# Patient Record
Sex: Male | Born: 1980
Health system: Southern US, Community
[De-identification: ages and names within clinical notes are randomized; demographics above are authoritative.]

## PROBLEM LIST (undated history)

## (undated) HISTORY — PX: KNEE SURGERY: SHX244

---
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
## 2006-07-12 ENCOUNTER — Emergency Department (HOSPITAL_COMMUNITY): Admission: EM | Admit: 2006-07-12 | Discharge: 2006-07-12 | Payer: Self-pay | Admitting: Emergency Medicine

## 2007-03-21 ENCOUNTER — Ambulatory Visit: Payer: Self-pay | Admitting: Internal Medicine

## 2007-04-17 ENCOUNTER — Emergency Department (HOSPITAL_COMMUNITY): Admission: EM | Admit: 2007-04-17 | Discharge: 2007-04-17 | Payer: Self-pay | Admitting: Emergency Medicine

## 2008-08-12 ENCOUNTER — Emergency Department (HOSPITAL_COMMUNITY): Admission: EM | Admit: 2008-08-12 | Discharge: 2008-08-12 | Payer: Self-pay | Admitting: Emergency Medicine

## 2008-08-24 ENCOUNTER — Emergency Department (HOSPITAL_COMMUNITY): Admission: EM | Admit: 2008-08-24 | Discharge: 2008-08-24 | Payer: Self-pay | Admitting: Family Medicine

## 2009-09-16 ENCOUNTER — Emergency Department (HOSPITAL_COMMUNITY): Admission: EM | Admit: 2009-09-16 | Discharge: 2009-09-16 | Payer: Self-pay | Admitting: Family Medicine

## 2011-01-30 ENCOUNTER — Inpatient Hospital Stay (INDEPENDENT_AMBULATORY_CARE_PROVIDER_SITE_OTHER)
Admission: RE | Admit: 2011-01-30 | Discharge: 2011-01-30 | Disposition: A | Discharge status: Home / Self Care | Payer: Self-pay | Source: Ambulatory Visit | Attending: Emergency Medicine | Admitting: Emergency Medicine

## 2011-01-30 DIAGNOSIS — R05 Cough: Secondary | ICD-10-CM

## 2011-01-30 DIAGNOSIS — J019 Acute sinusitis, unspecified: Secondary | ICD-10-CM

## 2011-02-11 ENCOUNTER — Emergency Department (HOSPITAL_COMMUNITY): Payer: Self-pay

## 2011-02-11 ENCOUNTER — Emergency Department (HOSPITAL_COMMUNITY)
Admission: EM | Admit: 2011-02-11 | Discharge: 2011-02-11 | Disposition: A | Discharge status: Home / Self Care | Payer: No Typology Code available for payment source | Attending: Emergency Medicine | Admitting: Emergency Medicine

## 2011-02-11 DIAGNOSIS — M25519 Pain in unspecified shoulder: Secondary | ICD-10-CM | POA: Insufficient documentation

## 2011-02-11 DIAGNOSIS — S335XXA Sprain of ligaments of lumbar spine, initial encounter: Secondary | ICD-10-CM | POA: Insufficient documentation

## 2011-02-11 DIAGNOSIS — M79609 Pain in unspecified limb: Secondary | ICD-10-CM | POA: Insufficient documentation

## 2011-02-11 DIAGNOSIS — IMO0002 Reserved for concepts with insufficient information to code with codable children: Secondary | ICD-10-CM | POA: Insufficient documentation

## 2011-02-11 DIAGNOSIS — M549 Dorsalgia, unspecified: Secondary | ICD-10-CM | POA: Insufficient documentation

## 2011-02-11 DIAGNOSIS — M542 Cervicalgia: Secondary | ICD-10-CM | POA: Insufficient documentation

## 2011-03-09 LAB — POCT URINALYSIS DIP (DEVICE)
Bilirubin Urine: NEGATIVE
Hgb urine dipstick: NEGATIVE
Ketones, ur: NEGATIVE mg/dL
Protein, ur: NEGATIVE mg/dL
Specific Gravity, Urine: 1.02 (ref 1.005–1.030)
Urobilinogen, UA: 0.2 mg/dL (ref 0.0–1.0)
pH: 6 (ref 5.0–8.0)

## 2011-04-21 NOTE — Assessment & Plan Note (Signed)
[facility]                             PULMONARY OFFICE NOTE   NAME:Darren Bartlett, Darren Bartlett                      MRN:          [id number]  DATE:03/21/2007                            DOB:          1981/09/26    Pulmonary new patient evaluation.   HISTORY:  A very nice, 30 year old, obese, black male who states he has  had asthma since childhood and typically uses his mother's albuterol but  now is using Primatene Mist several times a day to control symptomatic  wheezing with dyspnea that occurs both with activity and also  nocturnally. In fact, his last dose of Primatene is when he went to bed  last night. He denies any pleuritic pain, fevers, chills, sweats,  orthopnea or PND. He does have mild nasal congestion but no purulent  secretions.   He says the only time that he gets short of breath is when he panics  but he notes he is very sedentary and is not doing any kind of regular  exercise.   However, he denies any exertional chest pain, orthopnea, PND, leg  swelling or overt reflux symptoms.   PAST MEDICAL HISTORY:  Significant for obesity only.   ALLERGIES:  None known.   MEDICATIONS:  None regularly except p.r.n. Primatene.   SOCIAL HISTORY:  He quit smoking in 2006 and has gained significant  weight since then. Interestingly he did not notice any increase in  respiratory symptoms or need for albuterol after stopping smoking. He  works as a Designer, industrial/product.   FAMILY HISTORY:  Reviewed in detail and significant only for asthma in  his mother.   REVIEW OF SYSTEMS:  Reviewed in detail and significant for the problems  outlined above only.   PHYSICAL EXAMINATION:  GENERAL:  An obese, pleasant, ambulatory, black  male in no acute distress.  VITAL SIGNS:  He had stable vital signs.  HEENT:  Moderate turbinate edema. Oropharynx is clear.  There is no  evidence of excessive post nasal drainage or cobblestoning.  NECK:  Supple without cervical adenopathy  or tenderness. The trachea is  midline, no thyromegaly.  LUNGS:  Lung fields reveal trace wheeze with FVC maneuver only.  HEART:  He has a regular rate and rhythm without murmur, gallop or rub.  ABDOMEN:  Soft and benign.  EXTREMITIES:  Warm without calf tenderness, cyanosis, clubbing or edema.   IMPRESSION:  Life-long asthma with increased need for Primatene mist  over the last several months. I explained to the patient very carefully  why this was  a bad idea and emphasized morbidity and mortality with  asthma corresponds to excess beta 2 use with a goal of the rule of  twos.   To achieve this goal, I recommended instituting Symbicort 84.5 two puffs  b.i.d. and spent extra time teaching him how to use it. I fully realize  the patient will probably not use it consistently but at least with this  inhaler there will be immediate feedback in terms of both the benefit  and the lack of benefit when he is not using it as prescribed.  Followup will be in 6 weeks with a full set of PFTs since he is a smoker  to document whether or not we have reversed the reversible component  completely.     Charlaine Dalton. Sherene Sires, MD, [facility]  Electronically Signed    MBW/MedQ  DD: 03/21/2007  DT: 03/22/2007  Job #: [id number]

## 2011-06-19 ENCOUNTER — Inpatient Hospital Stay (INDEPENDENT_AMBULATORY_CARE_PROVIDER_SITE_OTHER)
Admission: RE | Admit: 2011-06-19 | Discharge: 2011-06-19 | Disposition: A | Discharge status: Home / Self Care | Payer: Self-pay | Source: Ambulatory Visit | Attending: Emergency Medicine | Admitting: Emergency Medicine

## 2011-06-19 DIAGNOSIS — F659 Paraphilia, unspecified: Secondary | ICD-10-CM

## 2011-06-19 LAB — HIV ANTIBODY (ROUTINE TESTING W REFLEX): HIV: NONREACTIVE

## 2011-06-20 LAB — GC/CHLAMYDIA PROBE AMP, GENITAL: Chlamydia, DNA Probe: NEGATIVE

## 2011-11-02 ENCOUNTER — Emergency Department (INDEPENDENT_AMBULATORY_CARE_PROVIDER_SITE_OTHER)
Admission: EM | Admit: 2011-11-02 | Discharge: 2011-11-02 | Disposition: A | Discharge status: Home / Self Care | Payer: Self-pay | Source: Home / Self Care | Attending: Family Medicine | Admitting: Family Medicine

## 2011-11-02 ENCOUNTER — Encounter: Payer: Self-pay | Admitting: Emergency Medicine

## 2011-11-02 DIAGNOSIS — B350 Tinea barbae and tinea capitis: Secondary | ICD-10-CM

## 2011-11-02 MED ORDER — GRISEOFULVIN MICROSIZE 500 MG PO TABS: 500.0000 mg | ORAL_TABLET | Freq: Every day | ORAL | Status: AC

## 2011-11-02 NOTE — ED Provider Notes (Signed)
History     CSN: [id number] Arrival date & time: 11/02/2011  9:27 PM   First MD Initiated Contact with Patient 11/02/11 1944      Chief Complaint  Patient presents with  . Rash    (Consider location/radiation/quality/duration/timing/severity/associated sxs/prior treatment) HPI Comments: Worried about itchy, grey, scaly patch of scalp on his RIGHT parietal area; exposed to tinea capitis in child of a friend, with whom he spends a lot of time; been present for approx 1 month; been trying over the counter topical preparations without relief. Denies any other symptoms.   Patient is a 30 y.o. male presenting with rash. The history is provided by the patient.  Rash  This is a new problem. The current episode started more than 1 week ago. The problem has not changed since onset.The rash is present on the scalp. The patient is experiencing no pain. Associated symptoms include itching. He has tried nothing for the symptoms.    History reviewed. No pertinent past medical history.  History reviewed. No pertinent past surgical history.  No family history on file.  History  Substance Use Topics  . Smoking status: Not on file  . Smokeless tobacco: Not on file  . Alcohol Use: Yes      Review of Systems  Constitutional: Negative.   HENT: Negative.   Eyes: Negative.   Respiratory: Negative.   Cardiovascular: Negative.   Gastrointestinal: Negative.   Genitourinary: Negative.   Musculoskeletal: Negative.   Skin: Positive for itching and rash.  Neurological: Negative.     Allergies  Review of patient's allergies indicates not on file.  Home Medications   Current Outpatient Rx  Name Route Sig Dispense Refill  . GRISEOFULVIN MICROSIZE 500 MG PO TABS Oral Take 1 tablet (500 mg total) by mouth daily. 42 tablet 0    BP 117/71  Pulse 73  Temp(Src) 98.2 F (36.8 C) (Tympanic)  Resp 20  SpO2 96%  Physical Exam  Constitutional: He is oriented to person, place, and time. He  appears well-developed and well-nourished.  HENT:  Head: Normocephalic and atraumatic.    Eyes: EOM are normal.  Neck: Normal range of motion.  Pulmonary/Chest: Effort normal.  Neurological: He is alert and oriented to person, place, and time.  Skin: Skin is warm and dry.    ED Course  Procedures (including critical care time)  Labs Reviewed - No data to display No results found.   1. Tinea capitis       MDM  Tinea capitis        Richardo Priest, MD 11/04/11 2324

## 2011-11-02 NOTE — ED Notes (Signed)
Pt here with possible ringworm to middle of scalp and itching that started 2 wks ago.pt states he was exposed to infection

## 2011-11-03 ENCOUNTER — Telehealth (HOSPITAL_COMMUNITY): Payer: Self-pay | Admitting: *Deleted

## 2014-09-04 ENCOUNTER — Ambulatory Visit: Payer: Self-pay | Admitting: Family Medicine

## 2014-09-04 LAB — DOT URINE DIP
BLOOD: NEGATIVE
GLUCOSE, UR: NEGATIVE
PROTEIN: NEGATIVE
SPECIFIC GRAVITY: 1.02 (ref 1.000–1.030)

## 2015-06-11 ENCOUNTER — Ambulatory Visit: Payer: Self-pay | Admitting: Internal Medicine

## 2015-06-14 ENCOUNTER — Ambulatory Visit: Payer: Self-pay | Admitting: Internal Medicine

## 2015-08-06 ENCOUNTER — Ambulatory Visit: Payer: Self-pay | Admitting: Internal Medicine

## 2018-12-01 ENCOUNTER — Emergency Department (HOSPITAL_COMMUNITY): Payer: Self-pay | Admitting: Certified Registered"

## 2018-12-01 ENCOUNTER — Encounter (HOSPITAL_COMMUNITY): Payer: Self-pay | Admitting: Emergency Medicine

## 2018-12-01 ENCOUNTER — Encounter (HOSPITAL_COMMUNITY): Admission: EM | Payer: Self-pay | Source: Home / Self Care | Attending: Emergency Medicine

## 2018-12-01 ENCOUNTER — Other Ambulatory Visit: Payer: Self-pay

## 2018-12-01 ENCOUNTER — Emergency Department (HOSPITAL_COMMUNITY): Payer: Self-pay

## 2018-12-01 ENCOUNTER — Ambulatory Visit (HOSPITAL_COMMUNITY)
Admission: EM | Admit: 2018-12-01 | Discharge: 2018-12-01 | Payer: Self-pay | Attending: Orthopedic Surgery | Admitting: Orthopedic Surgery

## 2018-12-01 DIAGNOSIS — S42451B Displaced fracture of lateral condyle of right humerus, initial encounter for open fracture: Secondary | ICD-10-CM | POA: Insufficient documentation

## 2018-12-01 DIAGNOSIS — S51001A Unspecified open wound of right elbow, initial encounter: Secondary | ICD-10-CM | POA: Insufficient documentation

## 2018-12-01 DIAGNOSIS — W3400XA Accidental discharge from unspecified firearms or gun, initial encounter: Secondary | ICD-10-CM

## 2018-12-01 DIAGNOSIS — M79641 Pain in right hand: Secondary | ICD-10-CM | POA: Insufficient documentation

## 2018-12-01 DIAGNOSIS — R200 Anesthesia of skin: Secondary | ICD-10-CM | POA: Insufficient documentation

## 2018-12-01 HISTORY — PX: I & D EXTREMITY: SHX5045

## 2018-12-01 LAB — CBC WITH DIFFERENTIAL/PLATELET
Abs Immature Granulocytes: 0.03 10*3/uL (ref 0.00–0.07)
BASOS ABS: 0.1 10*3/uL (ref 0.0–0.1)
Basophils Relative: 1 %
EOS ABS: 0.1 10*3/uL (ref 0.0–0.5)
EOS PCT: 1 %
HCT: 46.9 % (ref 39.0–52.0)
Hemoglobin: 14.7 g/dL (ref 13.0–17.0)
Immature Granulocytes: 0 %
LYMPHS ABS: 5 10*3/uL — AB (ref 0.7–4.0)
Lymphocytes Relative: 51 %
MCH: 28.5 pg (ref 26.0–34.0)
MCHC: 31.3 g/dL (ref 30.0–36.0)
MCV: 91.1 fL (ref 80.0–100.0)
MONO ABS: 0.6 10*3/uL (ref 0.1–1.0)
Monocytes Relative: 7 %
NRBC: 0 % (ref 0.0–0.2)
Neutro Abs: 3.9 10*3/uL (ref 1.7–7.7)
Neutrophils Relative %: 40 %
Platelets: 238 10*3/uL (ref 150–400)
RBC: 5.15 MIL/uL (ref 4.22–5.81)
RDW: 12.7 % (ref 11.5–15.5)
WBC: 9.7 10*3/uL (ref 4.0–10.5)

## 2018-12-01 LAB — BASIC METABOLIC PANEL
Anion gap: 15 (ref 5–15)
BUN: 16 mg/dL (ref 6–20)
CALCIUM: 8.9 mg/dL (ref 8.9–10.3)
CO2: 21 mmol/L — AB (ref 22–32)
CREATININE: 1.83 mg/dL — AB (ref 0.61–1.24)
Chloride: 104 mmol/L (ref 98–111)
GFR calc non Af Amer: 46 mL/min — ABNORMAL LOW (ref >60)
GFR, EST AFRICAN AMERICAN: 53 mL/min — AB (ref >60)
GLUCOSE: 157 mg/dL — AB (ref 70–99)
Potassium: 3.3 mmol/L — ABNORMAL LOW (ref 3.5–5.1)
Sodium: 140 mmol/L (ref 135–145)

## 2018-12-01 LAB — I-STAT CHEM 8, ED
BUN: 19 mg/dL (ref 6–20)
CALCIUM ION: 1.03 mmol/L — AB (ref 1.15–1.40)
Chloride: 106 mmol/L (ref 98–111)
Creatinine, Ser: 1.9 mg/dL — ABNORMAL HIGH (ref 0.61–1.24)
GLUCOSE: 159 mg/dL — AB (ref 70–99)
HCT: 46 % (ref 39.0–52.0)
Hemoglobin: 15.6 g/dL (ref 13.0–17.0)
Potassium: 3 mmol/L — ABNORMAL LOW (ref 3.5–5.1)
SODIUM: 142 mmol/L (ref 135–145)
TCO2: 23 mmol/L (ref 22–32)

## 2018-12-01 SURGERY — IRRIGATION AND DEBRIDEMENT EXTREMITY
Anesthesia: General | Site: Elbow | Laterality: Right

## 2018-12-01 MED ORDER — PROPOFOL 10 MG/ML IV BOLUS
INTRAVENOUS | Status: AC
  Filled 2018-12-01: qty 20

## 2018-12-01 MED ORDER — DEXAMETHASONE SODIUM PHOSPHATE 10 MG/ML IJ SOLN
INTRAMUSCULAR | Status: DC | PRN
  Administered 2018-12-01: 10 mg via INTRAVENOUS

## 2018-12-01 MED ORDER — LACTATED RINGERS IV SOLN
INTRAVENOUS | Status: DC | PRN
  Administered 2018-12-01: 07:00:00 via INTRAVENOUS

## 2018-12-01 MED ORDER — OXYCODONE HCL 5 MG PO TABS: 5.0000 mg | ORAL_TABLET | Freq: Once | ORAL | Status: DC | PRN

## 2018-12-01 MED ORDER — MIDAZOLAM HCL 5 MG/5ML IJ SOLN
INTRAMUSCULAR | Status: DC | PRN
  Administered 2018-12-01: 2 mg via INTRAVENOUS

## 2018-12-01 MED ORDER — LIDOCAINE 2% (20 MG/ML) 5 ML SYRINGE
INTRAMUSCULAR | Status: DC | PRN
  Administered 2018-12-01: 100 mg via INTRAVENOUS

## 2018-12-01 MED ORDER — CEFAZOLIN SODIUM-DEXTROSE 2-4 GM/100ML-% IV SOLN
INTRAVENOUS | Status: AC
  Filled 2018-12-01: qty 100

## 2018-12-01 MED ORDER — CEFAZOLIN SODIUM-DEXTROSE 2-3 GM-%(50ML) IV SOLR
INTRAVENOUS | Status: DC | PRN
  Administered 2018-12-01: 2 g via INTRAVENOUS

## 2018-12-01 MED ORDER — SODIUM CHLORIDE 0.9 % IV BOLUS
1000.0000 mL | Freq: Once | INTRAVENOUS | Status: AC
  Administered 2018-12-01: 1000 mL via INTRAVENOUS

## 2018-12-01 MED ORDER — ONDANSETRON HCL 4 MG/2ML IJ SOLN
INTRAMUSCULAR | Status: DC | PRN
  Administered 2018-12-01: 4 mg via INTRAVENOUS

## 2018-12-01 MED ORDER — SODIUM CHLORIDE 0.9 % IR SOLN
Status: DC | PRN
  Administered 2018-12-01: 3000 mL

## 2018-12-01 MED ORDER — FENTANYL CITRATE (PF) 250 MCG/5ML IJ SOLN
INTRAMUSCULAR | Status: DC | PRN
  Administered 2018-12-01 (×2): 100 ug via INTRAVENOUS
  Administered 2018-12-01: 50 ug via INTRAVENOUS

## 2018-12-01 MED ORDER — 0.9 % SODIUM CHLORIDE (POUR BTL) OPTIME
TOPICAL | Status: DC | PRN
  Administered 2018-12-01: 1000 mL

## 2018-12-01 MED ORDER — PROMETHAZINE HCL 25 MG/ML IJ SOLN: 6.2500 mg | INTRAMUSCULAR | Status: DC | PRN

## 2018-12-01 MED ORDER — CEFAZOLIN SODIUM-DEXTROSE 2-4 GM/100ML-% IV SOLN
2.0000 g | Freq: Once | INTRAVENOUS | Status: AC
  Administered 2018-12-01: 2 g via INTRAVENOUS
  Filled 2018-12-01: qty 100

## 2018-12-01 MED ORDER — HYDROMORPHONE HCL 1 MG/ML IJ SOLN: 0.2500 mg | INTRAMUSCULAR | Status: DC | PRN

## 2018-12-01 MED ORDER — PROPOFOL 10 MG/ML IV BOLUS
INTRAVENOUS | Status: DC | PRN
  Administered 2018-12-01: 200 mg via INTRAVENOUS

## 2018-12-01 MED ORDER — SUGAMMADEX SODIUM 500 MG/5ML IV SOLN
INTRAVENOUS | Status: DC | PRN
  Administered 2018-12-01: 300 mg via INTRAVENOUS

## 2018-12-01 MED ORDER — DEXAMETHASONE SODIUM PHOSPHATE 10 MG/ML IJ SOLN
INTRAMUSCULAR | Status: AC
  Filled 2018-12-01: qty 1

## 2018-12-01 MED ORDER — DEXMEDETOMIDINE HCL IN NACL 200 MCG/50ML IV SOLN
INTRAVENOUS | Status: DC | PRN
  Administered 2018-12-01: 12 ug via INTRAVENOUS

## 2018-12-01 MED ORDER — HYDROMORPHONE HCL 1 MG/ML IJ SOLN
1.0000 mg | Freq: Once | INTRAMUSCULAR | Status: AC
  Administered 2018-12-01: 1 mg via INTRAVENOUS
  Filled 2018-12-01: qty 1

## 2018-12-01 MED ORDER — FENTANYL CITRATE (PF) 250 MCG/5ML IJ SOLN
INTRAMUSCULAR | Status: AC
  Filled 2018-12-01: qty 5

## 2018-12-01 MED ORDER — ONDANSETRON HCL 4 MG/2ML IJ SOLN
4.0000 mg | Freq: Once | INTRAMUSCULAR | Status: AC
  Administered 2018-12-01: 4 mg via INTRAVENOUS
  Filled 2018-12-01: qty 2

## 2018-12-01 MED ORDER — SUCCINYLCHOLINE CHLORIDE 20 MG/ML IJ SOLN
INTRAMUSCULAR | Status: DC | PRN
  Administered 2018-12-01: 140 mg via INTRAVENOUS

## 2018-12-01 MED ORDER — ROCURONIUM BROMIDE 10 MG/ML (PF) SYRINGE
PREFILLED_SYRINGE | INTRAVENOUS | Status: DC | PRN
  Administered 2018-12-01: 30 mg via INTRAVENOUS

## 2018-12-01 MED ORDER — OXYCODONE HCL 5 MG/5ML PO SOLN: 5.0000 mg | Freq: Once | ORAL | Status: DC | PRN

## 2018-12-01 MED ORDER — LIDOCAINE 2% (20 MG/ML) 5 ML SYRINGE
INTRAMUSCULAR | Status: AC
  Filled 2018-12-01: qty 5

## 2018-12-01 MED ORDER — MEPERIDINE HCL 50 MG/ML IJ SOLN: 6.2500 mg | INTRAMUSCULAR | Status: DC | PRN

## 2018-12-01 MED ORDER — MIDAZOLAM HCL 2 MG/2ML IJ SOLN
INTRAMUSCULAR | Status: AC
  Filled 2018-12-01: qty 2

## 2018-12-01 SURGICAL SUPPLY — 30 items
BANDAGE ACE 4X5 VEL STRL LF (GAUZE/BANDAGES/DRESSINGS) ×5 IMPLANT
BNDG GAUZE ELAST 4 BULKY (GAUZE/BANDAGES/DRESSINGS) ×5 IMPLANT
CORDS BIPOLAR (ELECTRODE) ×3 IMPLANT
COVER SURGICAL LIGHT HANDLE (MISCELLANEOUS) ×3 IMPLANT
CUFF TOURNIQUET SINGLE 18IN (TOURNIQUET CUFF) ×5 IMPLANT
DRAIN PENROSE 1/4X12 LTX STRL (WOUND CARE) ×2 IMPLANT
DRAPE OEC MINIVIEW 54X84 (DRAPES) ×2 IMPLANT
DRSG ADAPTIC 3X8 NADH LF (GAUZE/BANDAGES/DRESSINGS) ×3 IMPLANT
DRSG XEROFORM 1X8 (GAUZE/BANDAGES/DRESSINGS) ×2 IMPLANT
GAUZE SPONGE 4X4 12PLY STRL (GAUZE/BANDAGES/DRESSINGS) ×3 IMPLANT
GLOVE SS BIOGEL STRL SZ 8 (GLOVE) ×1 IMPLANT
GLOVE SUPERSENSE BIOGEL SZ 8 (GLOVE) ×2
GOWN STRL REUS W/ TWL XL LVL3 (GOWN DISPOSABLE) ×2 IMPLANT
GOWN STRL REUS W/TWL XL LVL3 (GOWN DISPOSABLE) ×6
KIT BASIN OR (CUSTOM PROCEDURE TRAY) ×3 IMPLANT
KIT TURNOVER KIT B (KITS) ×3 IMPLANT
MANIFOLD NEPTUNE II (INSTRUMENTS) ×3 IMPLANT
NS IRRIG 1000ML POUR BTL (IV SOLUTION) ×3 IMPLANT
PACK ORTHO EXTREMITY (CUSTOM PROCEDURE TRAY) ×3 IMPLANT
PAD ARMBOARD 7.5X6 YLW CONV (MISCELLANEOUS) ×3 IMPLANT
PAD CAST 4YDX4 CTTN HI CHSV (CAST SUPPLIES) ×1 IMPLANT
PADDING CAST COTTON 4X4 STRL (CAST SUPPLIES) ×6
SCRUB BETADINE 4OZ XXX (MISCELLANEOUS) ×3 IMPLANT
SET CYSTO W/LG BORE CLAMP LF (SET/KITS/TRAYS/PACK) ×5 IMPLANT
TOWEL OR 17X24 6PK STRL BLUE (TOWEL DISPOSABLE) ×3 IMPLANT
TOWEL OR 17X26 10 PK STRL BLUE (TOWEL DISPOSABLE) ×3 IMPLANT
TUBE CONNECTING 12'X1/4 (SUCTIONS) ×1
TUBE CONNECTING 12X1/4 (SUCTIONS) ×2 IMPLANT
WATER STERILE IRR 1000ML POUR (IV SOLUTION) ×3 IMPLANT
YANKAUER SUCT BULB TIP NO VENT (SUCTIONS) ×3 IMPLANT

## 2018-12-01 NOTE — Anesthesia Preprocedure Evaluation (Signed)
Anesthesia Evaluation  Patient identified by MRN, date of birth, ID band Patient awake    Reviewed: Allergy & Precautions, NPO status , Patient's Chart, lab work & pertinent test results  Airway Mallampati: I  TM Distance: >3 FB Neck ROM: Full    Dental  (+) Dental Advisory Given   Pulmonary neg pulmonary ROS,    Pulmonary exam normal breath sounds clear to auscultation       Cardiovascular negative cardio ROS Normal cardiovascular exam Rhythm:Regular Rate:Normal     Neuro/Psych negative neurological ROS  negative psych ROS   GI/Hepatic negative GI ROS, Neg liver ROS,   Endo/Other  negative endocrine ROS  Renal/GU negative Renal ROS     Musculoskeletal negative musculoskeletal ROS (+)   Abdominal (+) + obese,   Peds  Hematology negative hematology ROS (+)   Anesthesia Other Findings   Reproductive/Obstetrics                             Anesthesia Physical Anesthesia Plan  ASA: II  Anesthesia Plan: General   Post-op Pain Management:    Induction: Intravenous  PONV Risk Score and Plan: 3 and Ondansetron, Dexamethasone and Midazolam  Airway Management Planned: LMA  Additional Equipment: None  Intra-op Plan:   Post-operative Plan: Extubation in OR  Informed Consent: I have reviewed the patients History and Physical, chart, labs and discussed the procedure including the risks, benefits and alternatives for the proposed anesthesia with the patient or authorized representative who has indicated his/her understanding and acceptance.   Dental advisory given  Plan Discussed with: CRNA  Anesthesia Plan Comments:         Anesthesia Quick Evaluation
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
## 2018-12-01 NOTE — Anesthesia Postprocedure Evaluation (Signed)
Anesthesia Post Note  Patient: Darren Bartlett  Procedure(s) Performed: IRRIGATION AND DEBRIDEMENT EXTREMITY (Right Elbow)     Patient location during evaluation: PACU Anesthesia Type: General Level of consciousness: sedated and patient cooperative Pain management: pain level controlled Vital Signs Assessment: post-procedure vital signs reviewed and stable Respiratory status: spontaneous breathing Cardiovascular status: stable Anesthetic complications: no    Last Vitals:  Vitals:   12/01/18 0916 12/01/18 1006  BP: (!) 146/86 139/84  Pulse: 63 82  Resp: 18 15  Temp: (!) 36.4 C 36.6 C  SpO2: 95% 100%    Last Pain:  Vitals:   12/01/18 0342  PainSc: 3                  Lewie LoronJohn Jaciel Diem

## 2018-12-01 NOTE — Transfer of Care (Signed)
Immediate Anesthesia Transfer of Care Note  Patient: Darren Bartlett  Procedure(s) Performed: IRRIGATION AND DEBRIDEMENT EXTREMITY (Right Elbow)  Patient Location: PACU  Anesthesia Type:General  Level of Consciousness: awake, alert , oriented and patient cooperative  Airway & Oxygen Therapy: Patient Spontanous Breathing and Patient connected to face mask oxygen  Post-op Assessment: Report given to RN, Post -op Vital signs reviewed and stable and Patient moving all extremities  Post vital signs: Reviewed and stable  Last Vitals:  Vitals Value Taken Time  BP 134/82 12/01/2018  8:16 AM  Temp    Pulse 74 12/01/2018  8:17 AM  Resp 19 12/01/2018  8:17 AM  SpO2 96 % 12/01/2018  8:17 AM  Vitals shown include unvalidated device data.  Last Pain:  Vitals:   12/01/18 0342  PainSc: 3          Complications: No apparent anesthesia complications
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
## 2018-12-01 NOTE — Anesthesia Procedure Notes (Signed)
Procedure Name: Intubation Date/Time: 12/01/2018 7:31 AM Performed by: Myna Bright, CRNA Pre-anesthesia Checklist: Patient identified, Emergency Drugs available, Suction available and Patient being monitored Patient Re-evaluated:Patient Re-evaluated prior to induction Oxygen Delivery Method: Circle system utilized Preoxygenation: Pre-oxygenation with 100% oxygen Induction Type: IV induction Ventilation: Mask ventilation without difficulty Laryngoscope Size: Mac and 4 Grade View: Grade I Tube type: Oral Tube size: 7.5 mm Number of attempts: 1 Airway Equipment and Method: Stylet Placement Confirmation: ETT inserted through vocal cords under direct vision,  positive ETCO2 and breath sounds checked- equal and bilateral Secured at: 22 cm Tube secured with: Tape Dental Injury: Teeth and Oropharynx as per pre-operative assessment
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
## 2018-12-01 NOTE — Progress Notes (Signed)
Discharge instructions and prescriptions reviewed with Verlon [facility]Leslie, [facility] [place] Patients parents at bedside and made aware of Patient being discharged in custody at this time.

## 2018-12-01 NOTE — Discharge Instructions (Signed)
Discharge to home. Keep dressing clean dry & intact. Do not change dressing. Follow up with Dr in 8-10 days.

## 2018-12-01 NOTE — ED Notes (Signed)
Patient transported to X-ray 
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
## 2018-12-01 NOTE — ED Notes (Signed)
Patient transported to CT 

## 2018-12-01 NOTE — Op Note (Signed)
See dict #[id number] Amanda PeaGramig MD

## 2018-12-02 ENCOUNTER — Encounter (HOSPITAL_COMMUNITY): Payer: Self-pay | Admitting: Orthopedic Surgery

## 2019-10-10 DIAGNOSIS — S39012A Strain of muscle, fascia and tendon of lower back, initial encounter: Secondary | ICD-10-CM | POA: Diagnosis not present

## 2019-11-17 ENCOUNTER — Other Ambulatory Visit (HOSPITAL_COMMUNITY)
Admission: RE | Admit: 2019-11-17 | Discharge: 2019-11-17 | Disposition: A | Discharge status: Home / Self Care | Payer: BC Managed Care – PPO | Source: Ambulatory Visit | Attending: Family Medicine | Admitting: Family Medicine

## 2019-11-17 ENCOUNTER — Encounter: Payer: Self-pay | Admitting: Family Medicine

## 2019-11-17 ENCOUNTER — Other Ambulatory Visit: Payer: Self-pay

## 2019-11-17 ENCOUNTER — Ambulatory Visit (INDEPENDENT_AMBULATORY_CARE_PROVIDER_SITE_OTHER): Payer: BC Managed Care – PPO | Admitting: Family Medicine

## 2019-11-17 VITALS — BP 116/62 | HR 76 | Temp 97.6°F | Ht 70.0 in | Wt 243.2 lb

## 2019-11-17 DIAGNOSIS — R350 Frequency of micturition: Secondary | ICD-10-CM | POA: Insufficient documentation

## 2019-11-17 DIAGNOSIS — Z0000 Encounter for general adult medical examination without abnormal findings: Secondary | ICD-10-CM | POA: Diagnosis not present

## 2019-11-17 LAB — COMPREHENSIVE METABOLIC PANEL
ALT: 38 U/L (ref 0–53)
AST: 24 U/L (ref 0–37)
Albumin: 4 g/dL (ref 3.5–5.2)
Alkaline Phosphatase: 58 U/L (ref 39–117)
BUN: 13 mg/dL (ref 6–23)
CO2: 24 mEq/L (ref 19–32)
Calcium: 9 mg/dL (ref 8.4–10.5)
Chloride: 110 mEq/L (ref 96–112)
Creatinine, Ser: 1.25 mg/dL (ref 0.40–1.50)
GFR: 77.69 mL/min (ref >60.00)
Glucose, Bld: 108 mg/dL — ABNORMAL HIGH (ref 70–99)
Potassium: 4.1 mEq/L (ref 3.5–5.1)
Sodium: 142 mEq/L (ref 135–145)
Total Bilirubin: 0.4 mg/dL (ref 0.2–1.2)
Total Protein: 6.8 g/dL (ref 6.0–8.3)

## 2019-11-17 LAB — POCT URINALYSIS DIPSTICK
Bilirubin, UA: NEGATIVE
Blood, UA: NEGATIVE
Glucose, UA: NEGATIVE
Ketones, UA: NEGATIVE
Leukocytes, UA: NEGATIVE
Nitrite, UA: NEGATIVE
Protein, UA: POSITIVE — AB
Spec Grav, UA: >=1.03 — AB (ref 1.010–1.025)
Urobilinogen, UA: 0.2 E.U./dL
pH, UA: 6 (ref 5.0–8.0)

## 2019-11-17 LAB — URINALYSIS, ROUTINE W REFLEX MICROSCOPIC
Bilirubin Urine: NEGATIVE
Hgb urine dipstick: NEGATIVE
Ketones, ur: NEGATIVE
Leukocytes,Ua: NEGATIVE
Nitrite: NEGATIVE
Specific Gravity, Urine: >=1.03 — AB (ref 1.000–1.030)
Total Protein, Urine: NEGATIVE
Urine Glucose: NEGATIVE
Urobilinogen, UA: 0.2 (ref 0.0–1.0)
pH: 6 (ref 5.0–8.0)

## 2019-11-17 LAB — CBC
HCT: 41.5 % (ref 39.0–52.0)
Hemoglobin: 13.7 g/dL (ref 13.0–17.0)
MCHC: 33.1 g/dL (ref 30.0–36.0)
MCV: 90 fl (ref 78.0–100.0)
Platelets: 179 10*3/uL (ref 150.0–400.0)
RBC: 4.61 Mil/uL (ref 4.22–5.81)
RDW: 13.4 % (ref 11.5–15.5)
WBC: 5.6 10*3/uL (ref 4.0–10.5)

## 2019-11-17 LAB — LDL CHOLESTEROL, DIRECT: Direct LDL: 99 mg/dL

## 2019-11-17 LAB — HEMOGLOBIN A1C: Hgb A1c MFr Bld: 5.7 % (ref 4.6–6.5)

## 2019-11-17 NOTE — Progress Notes (Addendum)
New Patient Office Visit  Subjective:  Patient ID: Darren Bartlett, male    DOB: 1981-11-26  Age: 38 y.o. MRN: [id number]  CC:  Chief Complaint  Patient presents with  . New Patient (Initial Visit)  . Annual Exam  . Urinary Frequency    denies dysuria    HPI Darren Bartlett presents for establishment of care and a complete physical exam.  Enjoys good health as far as he knows.  He is a Administrator.  He lives alone.  He rarely drinks alcohol.  Says that he drinks at most once monthly.  He does not use illicit drugs.  He does smoke cigars on a daily basis.  He was able to see the dentist earlier this year.  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  Sleep apnea was mentioned at his next exam.  He does not snore as far as he knows.  He has no significant daytime sleepiness.  He has been having urinary frequency.  He has known family history of diabetes that he is aware of.  He did have a bowl of cereal this morning.  History reviewed. No pertinent past medical history.  Past Surgical History:  Procedure Laterality Date  . I & D EXTREMITY Right 12/01/2018   Procedure: IRRIGATION AND DEBRIDEMENT EXTREMITY;  Surgeon: Roseanne Kaufman, MD;  Location: [facility];  Service: Orthopedics;  Laterality: Right;  . KNEE SURGERY      Family History  Problem Relation Age of Onset  . Asthma Mother   . Hypertension Mother   . Hypertension Father     Social History   Socioeconomic History  . Marital status: Single    Spouse name: Not on file  . Number of children: Not on file  . Years of education: Not on file  . Highest education level: Not on file  Occupational History  . Not on file  Tobacco Use  . Smoking status: Current Every Day Smoker    Types: Cigars  . Smokeless tobacco: Never Used  Substance and Sexual Activity  . Alcohol use: Yes    Comment: occ.  . Drug use: No  . Sexual activity: Yes    Birth control/protection: Condom  Other Topics Concern  . Not on file  Social History  Narrative  . Not on file   Social Determinants of Health   Financial Resource Strain:   . Difficulty of Paying Living Expenses: Not on file  Food Insecurity:   . Worried About Charity fundraiser in the Last Year: Not on file  . Ran Out of Food in the Last Year: Not on file  Transportation Needs:   . Lack of Transportation (Medical): Not on file  . Lack of Transportation (Non-Medical): Not on file  Physical Activity:   . Days of Exercise per Week: Not on file  . Minutes of Exercise per Session: Not on file  Stress:   . Feeling of Stress : Not on file  Social Connections:   . Frequency of Communication with Friends and Family: Not on file  . Frequency of Social Gatherings with Friends and Family: Not on file  . Attends Religious Services: Not on file  . Active Member of Clubs or Organizations: Not on file  . Attends Archivist Meetings: Not on file  . Marital Status: Not on file  Intimate Partner Violence:   . Fear of Current or Ex-Partner: Not on file  . Emotionally Abused: Not on file  .  Physically Abused: Not on file  . Sexually Abused: Not on file    ROS Review of Systems  Constitutional: Negative for chills, diaphoresis, fatigue, fever and unexpected weight change.  HENT: Negative.   Eyes: Negative for photophobia and visual disturbance.  Respiratory: Negative.   Cardiovascular: Negative.   Gastrointestinal: Negative.   Endocrine: Negative for polyphagia and polyuria.  Genitourinary: Positive for frequency. Negative for difficulty urinating, dysuria and urgency.  Musculoskeletal: Negative for joint swelling and myalgias.  Skin: Negative for pallor and rash.  Allergic/Immunologic: Negative for immunocompromised state.  Neurological: Negative for speech difficulty and numbness.  Hematological: Negative.   Psychiatric/Behavioral: Negative.     Objective:   Today's Vitals: BP 116/62   Pulse 76   Temp 97.6 F (36.4 C)   Ht '5\' 10"'$  (1.778 m)   Wt 243 lb  3.2 oz (110.3 kg)   SpO2 96%   BMI 34.90 kg/m   Physical Exam Vitals and nursing note reviewed.  Constitutional:      General: He is not in acute distress.    Appearance: Normal appearance. He is normal weight. He is not ill-appearing, toxic-appearing or diaphoretic.  HENT:     Head: Normocephalic and atraumatic.     Right Ear: Tympanic membrane, ear canal and external ear normal. There is no impacted cerumen.     Left Ear: Tympanic membrane, ear canal and external ear normal. There is no impacted cerumen.     Nose: No congestion or rhinorrhea.     Mouth/Throat:     Mouth: Mucous membranes are dry.     Pharynx: Oropharynx is clear. No oropharyngeal exudate or posterior oropharyngeal erythema.  Eyes:     General: No scleral icterus.       Right eye: No discharge.        Left eye: No discharge.     Conjunctiva/sclera: Conjunctivae normal.     Pupils: Pupils are equal, round, and reactive to light.  Neck:     Vascular: No carotid bruit.  Cardiovascular:     Rate and Rhythm: Normal rate and regular rhythm.     Heart sounds: No murmur.  Pulmonary:     Effort: Pulmonary effort is normal.     Breath sounds: Normal breath sounds.  Abdominal:     General: Abdomen is flat. Bowel sounds are normal. There is no distension.     Palpations: Abdomen is soft.     Tenderness: There is no abdominal tenderness. There is no guarding or rebound.     Hernia: No hernia is present. There is no hernia in the left inguinal area or right inguinal area.  Genitourinary:    Pubic Area: No rash or pubic lice.      Penis: Normal and circumcised. No hypospadias, erythema, tenderness, discharge, swelling or lesions.      Testes:        Right: Mass, tenderness or swelling not present. Right testis is descended.        Left: Mass, tenderness or swelling not present. Left testis is descended.  Musculoskeletal:     Cervical back: No rigidity or tenderness.     Right lower leg: No edema.     Left lower leg:  No edema.  Lymphadenopathy:     Cervical: No cervical adenopathy.     Lower Body: No right inguinal adenopathy. No left inguinal adenopathy.  Skin:    General: Skin is warm and dry.  Neurological:     Mental Status: He is alert and  oriented to person, place, and time.  Psychiatric:        Mood and Affect: Mood normal.        Behavior: Behavior normal.     Assessment & Plan:   Problem List Items Addressed This Visit      Other   Healthcare maintenance   Relevant Orders   CBC (Completed)   Comp Met (CMET) (Completed)   Direct LDL (Completed)   HgB A1c (Completed)   HIV antibody (with reflex) (Completed)   Urine frequency - Primary   Relevant Orders   POCT urinalysis dipstick (Completed)   HgB A1c (Completed)   Urinalysis, Routine w reflex microscopic (Completed)   Urine cytology ancillary only([facility]) (Completed)   Ambulatory referral to Urology      No outpatient encounter medications on file as of 11/17/2019.   No facility-administered encounter medications on file as of 11/17/2019.    Follow-up: Return in about 6 months (around 05/17/2020).  Patient given information on health maintenance and disease prevention.  He was also given information on the harmful effects of smoking and advised to stop.  Follow-up will pending results of lab work.  Libby Maw, MD

## 2019-11-17 NOTE — Patient Instructions (Signed)
Health Maintenance, Male Adopting a healthy lifestyle and getting preventive care are important in promoting health and wellness. Ask your health care provider about:  The right schedule for you to have regular tests and exams.  Things you can do on your own to prevent diseases and keep yourself healthy. What should I know about diet, weight, and exercise? Eat a healthy diet   Eat a diet that includes plenty of vegetables, fruits, low-fat dairy products, and lean protein.  Do not eat a lot of foods that are high in solid fats, added sugars, or sodium. Maintain a healthy weight Body mass index (BMI) is a measurement that can be used to identify possible weight problems. It estimates body fat based on height and weight. Your health care provider can help determine your BMI and help you achieve or maintain a healthy weight. Get regular exercise Get regular exercise. This is one of the most important things you can do for your health. Most adults should:  Exercise for at least 150 minutes each week. The exercise should increase your heart rate and make you sweat (moderate-intensity exercise).  Do strengthening exercises at least twice a week. This is in addition to the moderate-intensity exercise.  Spend less time sitting. Even light physical activity can be beneficial. Watch cholesterol and blood lipids Have your blood tested for lipids and cholesterol at 39 years of age, then have this test every 5 years. You may need to have your cholesterol levels checked more often if:  Your lipid or cholesterol levels are high.  You are older than 40 years of age.  You are at high risk for heart disease. What should I know about cancer screening? Many types of cancers can be detected early and may often be prevented. Depending on your health history and family history, you may need to have cancer screening at various ages. This may include screening for:  Colorectal cancer.  Prostate  cancer.  Skin cancer.  Lung cancer. What should I know about heart disease, diabetes, and high blood pressure? Blood pressure and heart disease  High blood pressure causes heart disease and increases the risk of stroke. This is more likely to develop in people who have high blood pressure readings, are of African descent, or are overweight.  Talk with your health care provider about your target blood pressure readings.  Have your blood pressure checked: ? Every 3-5 years if you are 18-39 years of age. ? Every year if you are 40 years old or older.  If you are between the ages of 65 and 75 and are a current or former smoker, ask your health care provider if you should have a one-time screening for abdominal aortic aneurysm (AAA). Diabetes Have regular diabetes screenings. This checks your fasting blood sugar level. Have the screening done:  Once every three years after age 45 if you are at a normal weight and have a low risk for diabetes.  More often and at a younger age if you are overweight or have a high risk for diabetes. What should I know about preventing infection? Hepatitis B If you have a higher risk for hepatitis B, you should be screened for this virus. Talk with your health care provider to find out if you are at risk for hepatitis B infection. Hepatitis C Blood testing is recommended for:  Everyone born from 1945 through 1965.  Anyone with known risk factors for hepatitis C. Sexually transmitted infections (STIs)  You should be screened each year   for STIs, including gonorrhea and chlamydia, if: ? You are sexually active and are younger than 39 years of age. ? You are older than 39 years of age and your health care provider tells you that you are at risk for this type of infection. ? Your sexual activity has changed since you were last screened, and you are at increased risk for chlamydia or gonorrhea. Ask your health care provider if you are at risk.  Ask your  health care provider about whether you are at high risk for HIV. Your health care provider may recommend a prescription medicine to help prevent HIV infection. If you choose to take medicine to prevent HIV, you should first get tested for HIV. You should then be tested every 3 months for as long as you are taking the medicine. Follow these instructions at home: Lifestyle  Do not use any products that contain nicotine or tobacco, such as cigarettes, e-cigarettes, and chewing tobacco. If you need help quitting, ask your health care provider.  Do not use street drugs.  Do not share needles.  Ask your health care provider for help if you need support or information about quitting drugs. Alcohol use  Do not drink alcohol if your health care provider tells you not to drink.  If you drink alcohol: ? Limit how much you have to 0-2 drinks a day. ? Be aware of how much alcohol is in your drink. In the [place] one drink equals one 12 oz bottle of beer (355 mL), one 5 oz glass of wine (148 mL), or one 1 oz glass of hard liquor (44 mL). General instructions  Schedule regular health, dental, and eye exams.  Stay current with your vaccines.  Tell your health care provider if: ? You often feel depressed. ? You have ever been abused or do not feel safe at home. Summary  Adopting a healthy lifestyle and getting preventive care are important in promoting health and wellness.  Follow your health care provider's instructions about healthy diet, exercising, and getting tested or screened for diseases.  Follow your health care provider's instructions on monitoring your cholesterol and blood pressure. This information is not intended to replace advice given to you by your health care provider. Make sure you discuss any questions you have with your health care provider. Document Released: 05/18/2008 Document Revised: 11/13/2018 Document Reviewed: 11/13/2018 Elsevier Patient Education  2020 [place] 90-29 Years Old, Male Preventive care refers to lifestyle choices and visits with your health care provider that can promote health and wellness. This includes:  A yearly physical exam. This is also called an annual well check.  Regular dental and eye exams.  Immunizations.  Screening for certain conditions.  Healthy lifestyle choices, such as eating a healthy diet, getting regular exercise, not using drugs or products that contain nicotine and tobacco, and limiting alcohol use. What can I expect for my preventive care visit? Physical exam Your health care provider will check:  Height and weight. These may be used to calculate body mass index (BMI), which is a measurement that tells if you are at a healthy weight.  Heart rate and blood pressure.  Your skin for abnormal spots. Counseling Your health care provider may ask you questions about:  Alcohol, tobacco, and drug use.  Emotional well-being.  Home and relationship well-being.  Sexual activity.  Eating habits.  Work and work Statistician. What immunizations do I need?  Influenza (flu) vaccine  This is recommended every  year. Tetanus, diphtheria, and pertussis (Tdap) vaccine  You may need a Td booster every 10 years. Varicella (chickenpox) vaccine  You may need this vaccine if you have not already been vaccinated. Human papillomavirus (HPV) vaccine  If recommended by your health care provider, you may need three doses over 6 months. Measles, mumps, and rubella (MMR) vaccine  You may need at least one dose of MMR. You may also need a second dose. Meningococcal conjugate (MenACWY) vaccine  One dose is recommended if you are 57-42 years old and a Market researcher living in a residence hall, or if you have one of several medical conditions. You may also need additional booster doses. Pneumococcal conjugate (PCV13) vaccine  You may need this if you have certain conditions and were  not previously vaccinated. Pneumococcal polysaccharide (PPSV23) vaccine  You may need one or two doses if you smoke cigarettes or if you have certain conditions. Hepatitis A vaccine  You may need this if you have certain conditions or if you travel or work in places where you may be exposed to hepatitis A. Hepatitis B vaccine  You may need this if you have certain conditions or if you travel or work in places where you may be exposed to hepatitis B. Haemophilus influenzae type b (Hib) vaccine  You may need this if you have certain risk factors. You may receive vaccines as individual doses or as more than one vaccine together in one shot (combination vaccines). Talk with your health care provider about the risks and benefits of combination vaccines. What tests do I need? Blood tests  Lipid and cholesterol levels. These may be checked every 5 years starting at age 5.  Hepatitis C test.  Hepatitis B test. Screening   Diabetes screening. This is done by checking your blood sugar (glucose) after you have not eaten for a while (fasting).  Sexually transmitted disease (STD) testing. Talk with your health care provider about your test results, treatment options, and if necessary, the need for more tests. Follow these instructions at home: Eating and drinking   Eat a diet that includes fresh fruits and vegetables, whole grains, lean protein, and low-fat dairy products.  Take vitamin and mineral supplements as recommended by your health care provider.  Do not drink alcohol if your health care provider tells you not to drink.  If you drink alcohol: ? Limit how much you have to 0-2 drinks a day. ? Be aware of how much alcohol is in your drink. In the [place] one drink equals one 12 oz bottle of beer (355 mL), one 5 oz glass of wine (148 mL), or one 1 oz glass of hard liquor (44 mL). Lifestyle  Take daily care of your teeth and gums.  Stay active. Exercise for at least 30 minutes on  5 or more days each week.  Do not use any products that contain nicotine or tobacco, such as cigarettes, e-cigarettes, and chewing tobacco. If you need help quitting, ask your health care provider.  If you are sexually active, practice safe sex. Use a condom or other form of protection to prevent STIs (sexually transmitted infections). What's next?  Go to your health care provider once a year for a well check visit.  Ask your health care provider how often you should have your eyes and teeth checked.  Stay up to date on all vaccines. This information is not intended to replace advice given to you by your health care provider. Make sure you discuss  any questions you have with your health care provider. Document Released: 01/16/2002 Document Revised: 11/14/2018 Document Reviewed: 11/14/2018 Elsevier Patient Education  2020 [place] Risks of Smoking Smoking cigarettes is very bad for your health. Tobacco smoke has over 200 known poisons in it. It contains the poisonous gases nitrogen oxide and carbon monoxide. There are over 60 chemicals in tobacco smoke that cause cancer. Smoking is difficult to quit because a chemical in tobacco, called nicotine, causes addiction or dependence. When you smoke and inhale, nicotine is absorbed rapidly into the bloodstream through your lungs. Both inhaled and non-inhaled nicotine may be addictive. What are the risks of cigarette smoke? Cigarette smokers have an increased risk of many serious medical problems, including:  Lung cancer.  Lung disease, such as pneumonia, bronchitis, and emphysema.  Chest pain (angina) and heart attack because the heart is not getting enough oxygen.  Heart disease and peripheral blood vessel disease.  High blood pressure (hypertension).  Stroke.  Oral cancer, including cancer of the lip, mouth, or voice box.  Bladder cancer.  Pancreatic cancer.  Cervical cancer.  Pregnancy complications, including  premature birth.  Stillbirths and smaller newborn babies, birth defects, and genetic damage to sperm.  Early menopause.  Lower estrogen level for women.  Infertility.  Facial wrinkles.  Blindness.  Increased risk of broken bones (fractures).  Senile dementia.  Stomach ulcers and internal bleeding.  Delayed wound healing and increased risk of complications during surgery.  Even smoking lightly shortens your life expectancy by several years. Because of secondhand smoke exposure, children of smokers have an increased risk of the following:  Sudden infant death syndrome (SIDS).  Respiratory infections.  Lung cancer.  Heart disease.  Ear infections. What are the benefits of quitting? There are many health benefits of quitting smoking. Here are some of them:  Within days of quitting smoking, your risk of having a heart attack decreases, your blood flow improves, and your lung capacity improves. Blood pressure, pulse rate, and breathing patterns start returning to normal soon after quitting.  Within months, your lungs may clear up completely.  Quitting for 10 years reduces your risk of developing lung cancer and heart disease to almost that of a nonsmoker.  People who quit may see an improvement in their overall quality of life. How do I quit smoking?     Smoking is an addiction with both physical and psychological effects, and longtime habits can be hard to change. Your health care provider can recommend:  Programs and community resources, which may include group support, education, or talk therapy.  Prescription medicines to help reduce cravings.  Nicotine replacement products, such as patches, gum, and nasal sprays. Use these products only as directed. Do not replace cigarette smoking with electronic cigarettes, which are commonly called e-cigarettes. The safety of e-cigarettes is not known, and some may contain harmful chemicals.  A combination of two or more of  these methods. Where to find more information  American Lung Association: www.lung.org  American Cancer Society: www.cancer.org Summary  Smoking cigarettes is very bad for your health. Cigarette smokers have an increased risk of many serious medical problems, including several cancers, heart disease, and stroke.  Smoking is an addiction with both physical and psychological effects, and longtime habits can be hard to change.  By stopping right away, you can greatly reduce the risk of medical problems for you and your family.  To help you quit smoking, your health care provider can recommend programs, community resources,  prescription medicines, and nicotine replacement products such as patches, gum, and nasal sprays. This information is not intended to replace advice given to you by your health care provider. Make sure you discuss any questions you have with your health care provider. Document Released: 12/28/2004 Document Revised: 02/21/2018 Document Reviewed: 11/24/2016 Elsevier Patient Education  2020 [employer].

## 2019-11-18 LAB — URINE CYTOLOGY ANCILLARY ONLY
Chlamydia: NEGATIVE
Comment: NEGATIVE
Comment: NORMAL
Neisseria Gonorrhea: NEGATIVE

## 2019-11-18 LAB — HIV ANTIBODY (ROUTINE TESTING W REFLEX): HIV 1&2 Ab, 4th Generation: NONREACTIVE

## 2019-11-20 NOTE — Addendum Note (Signed)
Addended by: Jon Billings on: 11/20/2019 04:19 PM   Modules accepted: Orders

## 2019-12-25 DIAGNOSIS — R3915 Urgency of urination: Secondary | ICD-10-CM | POA: Diagnosis not present

## 2019-12-25 DIAGNOSIS — R351 Nocturia: Secondary | ICD-10-CM | POA: Diagnosis not present

## 2019-12-25 DIAGNOSIS — R350 Frequency of micturition: Secondary | ICD-10-CM | POA: Diagnosis not present

## 2020-02-17 ENCOUNTER — Other Ambulatory Visit (INDEPENDENT_AMBULATORY_CARE_PROVIDER_SITE_OTHER): Payer: BC Managed Care – PPO

## 2020-02-17 ENCOUNTER — Other Ambulatory Visit: Payer: Self-pay

## 2020-02-17 ENCOUNTER — Other Ambulatory Visit: Payer: Self-pay | Admitting: Internal Medicine

## 2020-02-17 ENCOUNTER — Encounter: Payer: Self-pay | Admitting: Internal Medicine

## 2020-02-17 ENCOUNTER — Ambulatory Visit (INDEPENDENT_AMBULATORY_CARE_PROVIDER_SITE_OTHER)
Admission: RE | Admit: 2020-02-17 | Discharge: 2020-02-17 | Disposition: A | Discharge status: Home / Self Care | Payer: BC Managed Care – PPO | Source: Ambulatory Visit | Attending: Internal Medicine | Admitting: Internal Medicine

## 2020-02-17 ENCOUNTER — Ambulatory Visit (INDEPENDENT_AMBULATORY_CARE_PROVIDER_SITE_OTHER): Payer: BC Managed Care – PPO | Admitting: Internal Medicine

## 2020-02-17 ENCOUNTER — Ambulatory Visit: Payer: BC Managed Care – PPO

## 2020-02-17 DIAGNOSIS — J45991 Cough variant asthma: Secondary | ICD-10-CM | POA: Diagnosis not present

## 2020-02-17 DIAGNOSIS — R0602 Shortness of breath: Secondary | ICD-10-CM | POA: Diagnosis not present

## 2020-02-17 DIAGNOSIS — R05 Cough: Secondary | ICD-10-CM | POA: Diagnosis not present

## 2020-02-17 LAB — CBC WITH DIFFERENTIAL/PLATELET
Basophils Absolute: 0 10*3/uL (ref 0.0–0.1)
Basophils Relative: 0.6 % (ref 0.0–3.0)
Eosinophils Absolute: 0.1 10*3/uL (ref 0.0–0.7)
Eosinophils Relative: 1.1 % (ref 0.0–5.0)
HCT: 46.1 % (ref 39.0–52.0)
Hemoglobin: 15.4 g/dL (ref 13.0–17.0)
Lymphocytes Relative: 24.7 % (ref 12.0–46.0)
Lymphs Abs: 1.7 10*3/uL (ref 0.7–4.0)
MCHC: 33.4 g/dL (ref 30.0–36.0)
MCV: 90 fl (ref 78.0–100.0)
Monocytes Absolute: 0.4 10*3/uL (ref 0.1–1.0)
Monocytes Relative: 5.9 % (ref 3.0–12.0)
Neutro Abs: 4.7 10*3/uL (ref 1.4–7.7)
Neutrophils Relative %: 67.7 % (ref 43.0–77.0)
Platelets: 208 10*3/uL (ref 150.0–400.0)
RBC: 5.12 Mil/uL (ref 4.22–5.81)
RDW: 13.3 % (ref 11.5–15.5)
WBC: 6.9 10*3/uL (ref 4.0–10.5)

## 2020-02-17 MED ORDER — FAMOTIDINE 20 MG PO TABS: ORAL_TABLET | ORAL | 11 refills | Status: DC

## 2020-02-17 MED ORDER — PANTOPRAZOLE SODIUM 40 MG PO TBEC: 40.0000 mg | DELAYED_RELEASE_TABLET | Freq: Every day | ORAL | 2 refills | Status: DC

## 2020-02-17 MED ORDER — ALBUTEROL SULFATE HFA 108 (90 BASE) MCG/ACT IN AERS: INHALATION_SPRAY | RESPIRATORY_TRACT | 1 refills | Status: DC

## 2020-02-17 NOTE — Patient Instructions (Addendum)
Pantoprazole (protonix) 40 mg   Take  30-60 min before first meal of the day and Pepcid (famotidine)  20 mg one after supper or  bedtime until return to office - this is the best way to tell whether stomach acid is contributing to your problem.   GERD (REFLUX)  is an extremely common cause of respiratory symptoms just like yours , many times with no obvious heartburn at all.    It can be treated with medication, but also with lifestyle changes including elevation of the head of your bed (ideally with 6 -8inch blocks under the headboard of your bed),  Smoking cessation, avoidance of late meals, excessive alcohol, and avoid fatty foods, chocolate, peppermint, colas, red wine, and acidic juices such as orange juice.  NO MINT OR MENTHOL PRODUCTS SO NO COUGH DROPS  USE SUGARLESS CANDY INSTEAD (Jolley ranchers or Stover's or Life Savers) or even ice chips will also do - the key is to swallow to prevent all throat clearing. NO OIL BASED VITAMINS - use powdered substitutes.  Avoid fish oil when coughing.  Back up: Only use your albuterol (PROAIR/ red)  as a rescue medication to be used if you can't catch your breath by resting or doing a relaxed purse lip breathing pattern.  - The less you use it, the better it will work when you need it. - Ok to use up to 2 puffs  every 4 hours if you must but call for immediate appointment if use goes up over your usual need - Don't leave home without it !!  (think of it like the spare tire for your car)   For itching/ sneezing / runny nose with pollen or other exposures:  Allegra or clariton over the counter won't make you sleepy - take as needed   Please remember to go to the lab and x-ray department   for your tests - we will call you with the results when they are available.     Please schedule a follow up office visit in 6 weeks, call sooner if needed with all medications /inhalers/ solutions in hand so we can verify exactly what you are taking. This includes all  medications from all doctors and over the counters

## 2020-02-17 NOTE — Progress Notes (Signed)
Darren Bartlett, male    DOB: [birth year],    MRN: [id number]   Brief patient profile:  76 yobm cigar smoker remembers needing primatene was around age 39 for wheezing/sob but never with sports and eventually started needing saba prn but   not noct and was monthly then turned into more of a weekly event in 2020 so self-referred to pulmonary clinic 02/17/2020      History of Present Illness  02/17/2020  Pulmonary/ 1st office eval/Darren Bartlett  Chief Complaint  Patient presents with  . Pulmonary Consult    Self referral. Pt c/o wheezing and SOB off and on for years. He has concern for asthma b/c his mother has asthma. He occ uses an albuterol inhaler and this helps ease his symptoms some.   Dyspnea:  Still working out 3 x weekly including treadmill and bike s problem Cough: clears throat a lot but no mucus  Sleep: fine  SABA use: once or twice a week freq hot sensation in chest better with tums or burping  Works driving truck locally / older house with crawl space / central heat /air Some itching/ sneezing with pollen but not even using otcs/ works as Naval architect   No obvious day to day or daytime variability or assoc excess/ purulent sputum or mucus plugs or hemoptysis or cp or chest tightness, subjective wheeze.     sleeping without nocturnal  or early am exacerbation  of respiratory  c/o's or need for noct saba. Also denies any obvious fluctuation of symptoms with weather or environmental changes or other aggravating or alleviating factors except as outlined above   No unusual exposure hx or h/o childhood pna/ asthma or knowledge of premature birth.  Current Allergies, Complete Past Medical History, Past Surgical History, Family History, and Social History were reviewed in [employer] record.  ROS  The following are not active complaints unless bolded Hoarseness, sore throat, dysphagia, dental problems, itching, sneezing,  nasal congestion or discharge of excess mucus  or purulent secretions, ear ache,   fever, chills, sweats, unintended wt loss or wt gain, classically pleuritic or exertional cp,  orthopnea pnd or arm/hand swelling  or leg swelling, presyncope, palpitations, abdominal pain, anorexia, nausea, vomiting, diarrhea  or change in bowel habits or change in bladder habits, change in stools or change in urine, dysuria, hematuria,  rash, arthralgias, visual complaints, headache, numbness, weakness or ataxia or problems with walking or coordination,  change in mood or  memory.            No past medical history on file.  No outpatient medications prior to visit.   No facility-administered medications prior to visit.     Objective:     BP 140/84 (BP Location: Left Arm, Cuff Size: Normal)   Pulse 82   Temp 98.1 F (36.7 C) (Temporal)   Ht 5\' 10"  (1.778 m)   Wt 241 lb (109.3 kg)   SpO2 99% Comment: on RA  BMI 34.58 kg/m   SpO2: 99 %(on RA)   amb bm clearing throat    HEENT : pt wearing mask not removed for exam due to covid -19 concerns.    NECK :  without JVD/Nodes/TM/ nl carotid upstrokes bilaterally   LUNGS: no acc muscle use,  Nl contour chest which is clear to A and P bilaterally without cough on insp or exp maneuvers   CV:  RRR  no s3 or murmur or increase in P2, and no edema  ABD:  soft and nontender with nl inspiratory excursion in the supine position. No bruits or organomegaly appreciated, bowel sounds nl  MS:  Nl gait/ ext warm without deformities, calf tenderness, cyanosis or clubbing No obvious joint restrictions   SKIN: warm and dry without lesions    NEURO:  alert, approp, nl sensorium with  no motor or cerebellar deficits apparent.     CXR PA and Lateral:   02/17/2020 :    I personally reviewed images and agree with radiology impression as follows:   Negative.  No active cardiopulmonary disease.   Lab Results  Component Value Date   WBC 6.9 02/17/2020   HGB 15.4 02/17/2020   HCT 46.1 02/17/2020   MCV  90.0 02/17/2020   PLT 208.0 02/17/2020       EOS                                                               0.1                                    02/17/2020   Labs ordered 02/17/2020  :  allergy profile          Assessment   Cough variant asthma vs UACS  Onset age 14 never with ex/ sleep -  Allergy profile 02/17/2020 >  Eos 0.1 /  IgE pending - 02/17/2020 max rx for GERD/ saba prn    This is not by any means typical asthma and more likely Upper airway cough syndrome (previously labeled PNDS),  is so named because it's frequently impossible to sort out how much is  CR/sinusitis with freq throat clearing (which can be related to primary GERD)   vs  causing  secondary (" extra esophageal")  GERD from wide swings in gastric pressure that occur with throat clearing, often  promoting self use of mint and menthol lozenges that reduce the lower esophageal sphincter tone and exacerbate the problem further in a cyclical fashion.   These are the same pts (now being labeled as having "irritable larynx syndrome" by some cough centers) who not infrequently have a history of having failed to tolerate ace inhibitors,  dry powder inhalers or biphosphonates or report having atypical/extraesophageal reflux symptoms that don't respond to standard doses of PPI  and are easily confused as having aecopd or asthma flares by even experienced allergists/ pulmonologists (myself included).   >>>>   first try max rx for gerd with ppi, h2 and diet  and advised:  Only use your albuterol as a rescue medication to be used if you can't catch your breath by resting or doing a relaxed purse lip breathing pattern.  - The less you use it, the better it will work when you need it. - Ok to use up to 2 puffs  every 4 hours if you must but call for immediate appointment if use goes up over your usual need - Don't leave home without it !!  (think of it like the spare tire for your car)   And: If your breathing worsens or you need to  use your rescue inhaler more than twice weekly or wake up more than twice a month with any  respiratory symptoms or require more than two rescue inhalers per year, we need to see you right away because this means we're not controlling the underlying problem (inflammation) adequately.  Rescue inhalers (albuterol) do not control inflammation and overuse can lead to unnecessary and costly consequences.  They can make you feel better temporarily but eventually they will quit working effectively much as sleep aids lead to more insomnia if used regularly.         Each maintenance medication was reviewed in detail including emphasizing most importantly the difference between maintenance and prns and under what circumstances the prns are to be triggered using an action plan format where appropriate.  Total time for H and P, chart review, counseling, teaching device and generating customized AVS unique to this initial office visit / charting = 45 min            Christinia Gully, MD 02/17/2020

## 2020-02-18 ENCOUNTER — Encounter: Payer: Self-pay | Admitting: Internal Medicine

## 2020-02-18 LAB — IGE: IgE (Immunoglobulin E), Serum: 205 kU/L — ABNORMAL HIGH (ref <=114)

## 2020-02-18 NOTE — Assessment & Plan Note (Addendum)
Onset age 39 never with ex/ sleep -  Allergy profile 02/17/2020 >  Eos 0.1 /  IgE pending - 02/17/2020 max rx for GERD/ saba prn    This is not by any means typical asthma and more likely Upper airway cough syndrome (previously labeled PNDS),  is so named because it's frequently impossible to sort out how much is  CR/sinusitis with freq throat clearing (which can be related to primary GERD)   vs  causing  secondary (" extra esophageal")  GERD from wide swings in gastric pressure that occur with throat clearing, often  promoting self use of mint and menthol lozenges that reduce the lower esophageal sphincter tone and exacerbate the problem further in a cyclical fashion.   These are the same pts (now being labeled as having "irritable larynx syndrome" by some cough centers) who not infrequently have a history of having failed to tolerate ace inhibitors,  dry powder inhalers or biphosphonates or report having atypical/extraesophageal reflux symptoms that don't respond to standard doses of PPI  and are easily confused as having aecopd or asthma flares by even experienced allergists/ pulmonologists (myself included).   >>>>   first try max rx for gerd with ppi, h2 and diet  and advised:  Only use your albuterol as a rescue medication to be used if you can't catch your breath by resting or doing a relaxed purse lip breathing pattern.  - The less you use it, the better it will work when you need it. - Ok to use up to 2 puffs  every 4 hours if you must but call for immediate appointment if use goes up over your usual need - Don't leave home without it !!  (think of it like the spare tire for your car)   And: If your breathing worsens or you need to use your rescue inhaler more than twice weekly or wake up more than twice a month with any respiratory symptoms or require more than two rescue inhalers per year, we need to see you right away because this means we're not controlling the underlying problem  (inflammation) adequately.  Rescue inhalers (albuterol) do not control inflammation and overuse can lead to unnecessary and costly consequences.  They can make you feel better temporarily but eventually they will quit working effectively much as sleep aids lead to more insomnia if used regularly.         Each maintenance medication was reviewed in detail including emphasizing most importantly the difference between maintenance and prns and under what circumstances the prns are to be triggered using an action plan format where appropriate.  Total time for H and P, chart review, counseling, teaching device and generating customized AVS unique to this initial office visit / charting = 45 min

## 2020-02-19 NOTE — Progress Notes (Signed)
Spoke with pt and notified of results per Dr. Wert. Pt verbalized understanding.

## 2020-02-19 NOTE — Progress Notes (Signed)
Spoke with pt and notified of results per Dr. Wert. Pt verbalized understanding and denied any questions. 

## 2020-05-19 ENCOUNTER — Other Ambulatory Visit: Payer: Self-pay

## 2020-05-20 ENCOUNTER — Ambulatory Visit: Payer: Self-pay | Admitting: Family Medicine

## 2020-05-25 ENCOUNTER — Encounter: Payer: Self-pay | Admitting: Family Medicine

## 2020-06-02 ENCOUNTER — Encounter: Payer: Self-pay | Admitting: Family Medicine

## 2020-06-02 ENCOUNTER — Telehealth: Payer: Self-pay | Admitting: Family Medicine

## 2020-06-02 NOTE — Telephone Encounter (Signed)
Pt was no show for appt 05/20/2020. A no show letter has been mailed. This is the first no show and the fee waived as a one time courtesy.   PCP,  Please reply back with corresponding letter matching appropriate follow up needs.  A - No follow up necessary B - Follow up urgent - locate patient immediately to schedule appointment. C - Follow up necessary. Contact patient and schedule visit w/in 7 days. D - Follow up necessary. Contact patient and schedule visit w/in 2-4 weeks.

## 2020-06-03 NOTE — Telephone Encounter (Signed)
May follow up when ready.

## 2020-06-21 ENCOUNTER — Telehealth: Payer: Self-pay | Admitting: Family Medicine

## 2020-06-21 NOTE — Telephone Encounter (Signed)
Called patient to reschedule appt missed from 05/20/20. Patient stated he will call back to reschedule.
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
## 2021-04-29 ENCOUNTER — Encounter (HOSPITAL_COMMUNITY): Payer: Self-pay

## 2021-04-29 ENCOUNTER — Other Ambulatory Visit: Payer: Self-pay

## 2021-04-29 ENCOUNTER — Ambulatory Visit (HOSPITAL_COMMUNITY)
Admission: EM | Admit: 2021-04-29 | Discharge: 2021-04-29 | Disposition: A | Discharge status: Home / Self Care | Payer: Self-pay | Attending: Internal Medicine | Admitting: Internal Medicine

## 2021-04-29 DIAGNOSIS — J069 Acute upper respiratory infection, unspecified: Secondary | ICD-10-CM | POA: Insufficient documentation

## 2021-04-29 DIAGNOSIS — F17210 Nicotine dependence, cigarettes, uncomplicated: Secondary | ICD-10-CM | POA: Insufficient documentation

## 2021-04-29 DIAGNOSIS — R509 Fever, unspecified: Secondary | ICD-10-CM | POA: Insufficient documentation

## 2021-04-29 DIAGNOSIS — Z20822 Contact with and (suspected) exposure to covid-19: Secondary | ICD-10-CM | POA: Insufficient documentation

## 2021-04-29 DIAGNOSIS — R059 Cough, unspecified: Secondary | ICD-10-CM | POA: Insufficient documentation

## 2021-04-29 MED ORDER — PROMETHAZINE-DM 6.25-15 MG/5ML PO SYRP: 5.0000 mL | ORAL_SOLUTION | Freq: Three times a day (TID) | ORAL | 0 refills | Status: DC | PRN

## 2021-04-29 MED ORDER — IBUPROFEN 800 MG PO TABS
ORAL_TABLET | ORAL | Status: AC
  Filled 2021-04-29: qty 1

## 2021-04-29 MED ORDER — ACETAMINOPHEN 325 MG PO TABS
ORAL_TABLET | ORAL | Status: AC
  Filled 2021-04-29: qty 2

## 2021-04-29 MED ORDER — ALBUTEROL SULFATE HFA 108 (90 BASE) MCG/ACT IN AERS: INHALATION_SPRAY | RESPIRATORY_TRACT | 0 refills | Status: DC

## 2021-04-29 MED ORDER — IBUPROFEN 800 MG PO TABS
800.0000 mg | ORAL_TABLET | Freq: Once | ORAL | Status: AC
  Administered 2021-04-29: 800 mg via ORAL

## 2021-04-29 MED ORDER — ACETAMINOPHEN 325 MG PO TABS
650.0000 mg | ORAL_TABLET | Freq: Once | ORAL | Status: AC
  Administered 2021-04-29: 650 mg via ORAL

## 2021-04-29 NOTE — Discharge Instructions (Addendum)
I suspect you have a virus.  We will contact you if your COVID-19 testing is positive.  Please alternate Tylenol and ibuprofen for fever relief.  I have called in an albuterol inhaler to use for coughing fits or shortness of breath.  Take Promethazine DM up to 3 times a day as needed for cough.  This will make you sleepy so please do not drive or drink alcohol with this.  Use Mucinex and Flonase for additional symptom relief.  If anything worsens please return for reevaluation.

## 2021-04-29 NOTE — ED Provider Notes (Addendum)
[facility]    CSN: [id number] Arrival date & time: 04/29/21  1704      History   Chief Complaint Chief Complaint  Patient presents with  . Chills  . Fever    HPI Darren Bartlett is a 40 y.o. male.   Patient presents today with a 2-day history of fever.  He reports associated chills, body aches, dry cough.  Denies any chest pain, shortness of breath, nausea, vomiting.  He has taken Motrin without improvement of symptoms.  He denies any known sick contacts.  He has had COVID-19 vaccinations but has not had booster.  He has not had flu shot.  He denies history of allergies but does have a history of asthma.  He does not have an albuterol inhaler available but does not feel like he has needed this since symptom onset.  He is requesting refill to have on hand in case needed in the future.  He is a current everyday smoker smoking is typical amount.  He has not missed work as result of symptoms.     History reviewed. No pertinent past medical history.  Patient Active Problem List   Diagnosis Date Noted  . Cough variant asthma vs UACS  02/17/2020  . Healthcare maintenance 11/17/2019  . Urine frequency 11/17/2019    Past Surgical History:  Procedure Laterality Date  . I & D EXTREMITY Right 12/01/2018   Procedure: IRRIGATION AND DEBRIDEMENT EXTREMITY;  Surgeon: Dominica Severin, MD;  Location: [facility] OR;  Service: Orthopedics;  Laterality: Right;  . KNEE SURGERY         Home Medications    Prior to Admission medications   Medication Sig Start Date End Date Taking? Authorizing Provider  promethazine-dextromethorphan (PROMETHAZINE-DM) 6.25-15 MG/5ML syrup Take 5 mLs by mouth 3 (three) times daily as needed for cough. 04/29/21  Yes Keller Mikels K, PA-C  albuterol (PROAIR HFA) 108 (90 Base) MCG/ACT inhaler 2 puffs every 4 hours as needed only  if your can't catch your breath 04/29/21   Danija Gosa, Denny Peon K, PA-C  famotidine (PEPCID) 20 MG tablet One after supper 02/17/20   Nyoka Cowden, MD  pantoprazole (PROTONIX) 40 MG tablet Take 1 tablet (40 mg total) by mouth daily. Take 30-60 min before first meal of the day 02/17/20   Nyoka Cowden, MD    Family History Family History  Problem Relation Age of Onset  . Asthma Mother   . Hypertension Mother   . Hypertension Father     Social History Social History   Tobacco Use  . Smoking status: Current Every Day Smoker    Years: 14.00    Types: Cigars  . Smokeless tobacco: Never Used  . Tobacco comment: 1 cigar daily  Vaping Use  . Vaping Use: Never used  Substance Use Topics  . Alcohol use: Yes    Comment: occ.  . Drug use: No     Allergies   Patient has no known allergies.   Review of Systems Review of Systems  Constitutional: Positive for activity change, appetite change, chills, fatigue and fever.  HENT: Negative for congestion, sinus pressure, sneezing and sore throat.   Respiratory: Positive for cough. Negative for shortness of breath.   Cardiovascular: Negative for chest pain.  Gastrointestinal: Negative for abdominal pain, diarrhea, nausea and vomiting.  Musculoskeletal: Positive for arthralgias and myalgias.  Neurological: Positive for headaches. Negative for dizziness and light-headedness.     Physical Exam Triage Vital Signs ED Triage Vitals  Enc  Vitals Group     BP 04/29/21 1846 (!) 147/92     Pulse Rate 04/29/21 1846 94     Resp 04/29/21 1846 17     Temp 04/29/21 1846 (!) 103.2 F (39.6 C)     Temp Source 04/29/21 1846 Oral     SpO2 04/29/21 1846 96 %     Weight --      Height --      Head Circumference --      Peak Flow --      Pain Score 04/29/21 1845 0     Pain Loc --      Pain Edu? --      Excl. in GC? --    No data found.  Updated Vital Signs BP (!) 147/92 (BP Location: Left Arm)   Pulse 94   Temp (!) 101.7 F (38.7 C) (Oral)   Resp 17   SpO2 96%   Visual Acuity Right Eye Distance:   Left Eye Distance:   Bilateral Distance:    Right Eye Near:   Left  Eye Near:    Bilateral Near:     Physical Exam Vitals reviewed.  Constitutional:      General: He is awake.     Appearance: Normal appearance. He is normal weight. He is not ill-appearing.     Comments: Very pleasant male appears stated age in no acute distress  HENT:     Head: Normocephalic and atraumatic.     Right Ear: Tympanic membrane, ear canal and external ear normal. Tympanic membrane is not erythematous or bulging.     Left Ear: Tympanic membrane, ear canal and external ear normal. Tympanic membrane is not erythematous or bulging.     Nose: Nose normal.     Right Sinus: No maxillary sinus tenderness or frontal sinus tenderness.     Left Sinus: No maxillary sinus tenderness or frontal sinus tenderness.     Mouth/Throat:     Pharynx: Uvula midline. No oropharyngeal exudate or posterior oropharyngeal erythema.  Cardiovascular:     Rate and Rhythm: Normal rate and regular rhythm.     Heart sounds: Normal heart sounds. No murmur heard.   Pulmonary:     Effort: Pulmonary effort is normal. No accessory muscle usage or respiratory distress.     Breath sounds: Normal breath sounds. No stridor. No wheezing, rhonchi or rales.     Comments: Clear to auscultation bilaterally Abdominal:     General: Bowel sounds are normal.     Palpations: Abdomen is soft.     Tenderness: There is no abdominal tenderness.  Musculoskeletal:     Cervical back: Normal range of motion and neck supple.  Lymphadenopathy:     Head:     Right side of head: No submental, submandibular or tonsillar adenopathy.     Left side of head: No submental, submandibular or tonsillar adenopathy.     Cervical: No cervical adenopathy.  Neurological:     Mental Status: He is alert.  Psychiatric:        Behavior: Behavior is cooperative.      UC Treatments / Results  Labs (all labs ordered are listed, but only abnormal results are displayed) Labs Reviewed  SARS CORONAVIRUS 2 (TAT 6-24 HRS)     EKG   Radiology No results found.  Procedures Procedures (including critical care time)  Medications Ordered in UC Medications  acetaminophen (TYLENOL) tablet 650 mg (650 mg Oral Given 04/29/21 1850)  ibuprofen (ADVIL) tablet 800 mg (  800 mg Oral Given 04/29/21 1919)    Initial Impression / Assessment and Plan / UC Course  I have reviewed the triage vital signs and the nursing notes.  Pertinent labs & imaging results that were available during my care of the patient were reviewed by me and considered in my medical decision making (see chart for details).     Unfortunately, we do not have any flu tests available but this would not change management as patient has been symptomatic for 2 days.  He was tested for COVID-19-results pending.  Discussed likely viral etiology given short duration of symptoms.  No evidence of acute infection that warrant initiation of antibiotics based on today's exam.  He was prescribed albuterol inhaler to have on hand for cough and shortness of breath symptoms.  He was prescribed Promethazine DM with instruction not to drive or drink alcohol with this medication as drowsiness is a common side effect.  Discussed alarm symptoms that warrant emergent evaluation.  Strict return precautions given to which patient expressed understanding.  Final Clinical Impressions(s) / UC Diagnoses   Final diagnoses:  Upper respiratory tract infection, unspecified type  Fever, unspecified  Cough     Discharge Instructions     I suspect you have a virus.  We will contact you if your COVID-19 testing is positive.  Please alternate Tylenol and ibuprofen for fever relief.  I have called in an albuterol inhaler to use for coughing fits or shortness of breath.  Take Promethazine DM up to 3 times a day as needed for cough.  This will make you sleepy so please do not drive or drink alcohol with this.  Use Mucinex and Flonase for additional symptom relief.  If anything worsens  please return for reevaluation.    ED Prescriptions    Medication Sig Dispense Auth. Provider   albuterol (PROAIR HFA) 108 (90 Base) MCG/ACT inhaler 2 puffs every 4 hours as needed only  if your can't catch your breath 8 g Brynlyn Dade K, PA-C   promethazine-dextromethorphan (PROMETHAZINE-DM) 6.25-15 MG/5ML syrup Take 5 mLs by mouth 3 (three) times daily as needed for cough. 118 mL Neita Landrigan K, PA-C     PDMP not reviewed this encounter.   Jeani Hawking, PA-C 04/29/21 1912    Armonee Bojanowski, Noberto Retort, PA-C 04/29/21 1946

## 2021-04-29 NOTE — ED Triage Notes (Signed)
Pt c/o chills and fever x 2 days. Pt states he has not been having an appetite and states he has been having night sweats.

## 2021-04-30 LAB — SARS CORONAVIRUS 2 (TAT 6-24 HRS): SARS Coronavirus 2: NEGATIVE

## 2021-05-01 ENCOUNTER — Emergency Department (HOSPITAL_COMMUNITY): Payer: Self-pay

## 2021-05-01 ENCOUNTER — Encounter (HOSPITAL_COMMUNITY): Payer: Self-pay

## 2021-05-01 ENCOUNTER — Emergency Department (HOSPITAL_COMMUNITY)
Admission: EM | Admit: 2021-05-01 | Discharge: 2021-05-01 | Disposition: A | Discharge status: Home / Self Care | Payer: Self-pay | Attending: Emergency Medicine | Admitting: Emergency Medicine

## 2021-05-01 DIAGNOSIS — J069 Acute upper respiratory infection, unspecified: Secondary | ICD-10-CM | POA: Insufficient documentation

## 2021-05-01 DIAGNOSIS — R7989 Other specified abnormal findings of blood chemistry: Secondary | ICD-10-CM

## 2021-05-01 DIAGNOSIS — M7918 Myalgia, other site: Secondary | ICD-10-CM | POA: Insufficient documentation

## 2021-05-01 DIAGNOSIS — F17290 Nicotine dependence, other tobacco product, uncomplicated: Secondary | ICD-10-CM | POA: Insufficient documentation

## 2021-05-01 DIAGNOSIS — M791 Myalgia, unspecified site: Secondary | ICD-10-CM

## 2021-05-01 DIAGNOSIS — R7401 Elevation of levels of liver transaminase levels: Secondary | ICD-10-CM | POA: Insufficient documentation

## 2021-05-01 DIAGNOSIS — R110 Nausea: Secondary | ICD-10-CM | POA: Insufficient documentation

## 2021-05-01 DIAGNOSIS — R509 Fever, unspecified: Secondary | ICD-10-CM

## 2021-05-01 DIAGNOSIS — R059 Cough, unspecified: Secondary | ICD-10-CM

## 2021-05-01 DIAGNOSIS — Z20822 Contact with and (suspected) exposure to covid-19: Secondary | ICD-10-CM | POA: Insufficient documentation

## 2021-05-01 LAB — URINALYSIS, ROUTINE W REFLEX MICROSCOPIC
Bacteria, UA: NONE SEEN
Bilirubin Urine: NEGATIVE
Glucose, UA: NEGATIVE mg/dL
Hgb urine dipstick: NEGATIVE
Ketones, ur: NEGATIVE mg/dL
Leukocytes,Ua: NEGATIVE
Nitrite: NEGATIVE
Protein, ur: 100 mg/dL — AB
Specific Gravity, Urine: 1.025 (ref 1.005–1.030)
pH: 6 (ref 5.0–8.0)

## 2021-05-01 LAB — CBC WITH DIFFERENTIAL/PLATELET
Abs Immature Granulocytes: 0.02 10*3/uL (ref 0.00–0.07)
Basophils Absolute: 0 10*3/uL (ref 0.0–0.1)
Basophils Relative: 0 %
Eosinophils Absolute: 0 10*3/uL (ref 0.0–0.5)
Eosinophils Relative: 1 %
HCT: 40.5 % (ref 39.0–52.0)
Hemoglobin: 13.2 g/dL (ref 13.0–17.0)
Immature Granulocytes: 0 %
Lymphocytes Relative: 9 %
Lymphs Abs: 0.7 10*3/uL (ref 0.7–4.0)
MCH: 29.7 pg (ref 26.0–34.0)
MCHC: 32.6 g/dL (ref 30.0–36.0)
MCV: 91.2 fL (ref 80.0–100.0)
Monocytes Absolute: 0.7 10*3/uL (ref 0.1–1.0)
Monocytes Relative: 10 %
Neutro Abs: 6.1 10*3/uL (ref 1.7–7.7)
Neutrophils Relative %: 80 %
Platelets: 142 10*3/uL — ABNORMAL LOW (ref 150–400)
RBC: 4.44 MIL/uL (ref 4.22–5.81)
RDW: 12.5 % (ref 11.5–15.5)
WBC: 7.6 10*3/uL (ref 4.0–10.5)
nRBC: 0 % (ref 0.0–0.2)

## 2021-05-01 LAB — COMPREHENSIVE METABOLIC PANEL
ALT: 157 U/L — ABNORMAL HIGH (ref 0–44)
AST: 128 U/L — ABNORMAL HIGH (ref 15–41)
Albumin: 3.1 g/dL — ABNORMAL LOW (ref 3.5–5.0)
Alkaline Phosphatase: 87 U/L (ref 38–126)
Anion gap: 10 (ref 5–15)
BUN: 8 mg/dL (ref 6–20)
CO2: 22 mmol/L (ref 22–32)
Calcium: 8.6 mg/dL — ABNORMAL LOW (ref 8.9–10.3)
Chloride: 102 mmol/L (ref 98–111)
Creatinine, Ser: 1.46 mg/dL — ABNORMAL HIGH (ref 0.61–1.24)
GFR, Estimated: >60 mL/min (ref >60)
Glucose, Bld: 138 mg/dL — ABNORMAL HIGH (ref 70–99)
Potassium: 3.7 mmol/L (ref 3.5–5.1)
Sodium: 134 mmol/L — ABNORMAL LOW (ref 135–145)
Total Bilirubin: 0.6 mg/dL (ref 0.3–1.2)
Total Protein: 6.5 g/dL (ref 6.5–8.1)

## 2021-05-01 LAB — RESP PANEL BY RT-PCR (FLU A&B, COVID) ARPGX2
Influenza A by PCR: NEGATIVE
Influenza B by PCR: NEGATIVE
SARS Coronavirus 2 by RT PCR: NEGATIVE

## 2021-05-01 LAB — LACTIC ACID, PLASMA
Lactic Acid, Venous: 2.2 mmol/L (ref 0.5–1.9)
Lactic Acid, Venous: 1.2 mmol/L (ref 0.5–1.9)

## 2021-05-01 LAB — TROPONIN I (HIGH SENSITIVITY)
Troponin I (High Sensitivity): 9 ng/L (ref <18)
Troponin I (High Sensitivity): 9 ng/L (ref <18)

## 2021-05-01 MED ORDER — SODIUM CHLORIDE 0.9 % IV BOLUS
500.0000 mL | Freq: Once | INTRAVENOUS | Status: AC
  Administered 2021-05-01: 500 mL via INTRAVENOUS

## 2021-05-01 MED ORDER — PROCHLORPERAZINE EDISYLATE 10 MG/2ML IJ SOLN
10.0000 mg | Freq: Once | INTRAMUSCULAR | Status: AC
  Administered 2021-05-01: 10 mg via INTRAVENOUS
  Filled 2021-05-01: qty 2

## 2021-05-01 MED ORDER — DIPHENHYDRAMINE HCL 50 MG/ML IJ SOLN
50.0000 mg | Freq: Once | INTRAMUSCULAR | Status: AC
  Administered 2021-05-01: 50 mg via INTRAVENOUS
  Filled 2021-05-01: qty 1

## 2021-05-01 MED ORDER — SODIUM CHLORIDE 0.9 % IV BOLUS
1000.0000 mL | Freq: Once | INTRAVENOUS | Status: AC
  Administered 2021-05-01: 1000 mL via INTRAVENOUS

## 2021-05-01 MED ORDER — PROCHLORPERAZINE MALEATE 10 MG PO TABS: 10.0000 mg | ORAL_TABLET | Freq: Three times a day (TID) | ORAL | 0 refills | Status: DC | PRN

## 2021-05-01 NOTE — ED Provider Notes (Signed)
[facility] EMERGENCY DEPARTMENT Provider Note   CSN: [id number] Arrival date & time: 05/01/21  2831     History No chief complaint on file.   Darren Bartlett is a 40 y.o. male.  The history is provided by the patient and medical records. No language interpreter was used.  URI Presenting symptoms: congestion, cough, fatigue, fever and rhinorrhea   Severity:  Severe Onset quality:  Gradual Duration:  4 days Timing:  Constant Progression:  Unchanged Chronicity:  New Relieved by:  Inhaler Worsened by:  Nothing Ineffective treatments:  None tried Associated symptoms: myalgias   Associated symptoms: no headaches and no wheezing        No past medical history on file.  Patient Active Problem List   Diagnosis Date Noted  . Cough variant asthma vs UACS  02/17/2020  . Healthcare maintenance 11/17/2019  . Urine frequency 11/17/2019    Past Surgical History:  Procedure Laterality Date  . I & D EXTREMITY Right 12/01/2018   Procedure: IRRIGATION AND DEBRIDEMENT EXTREMITY;  Surgeon: Dominica Severin, MD;  Location: [facility] OR;  Service: Orthopedics;  Laterality: Right;  . KNEE SURGERY         Family History  Problem Relation Age of Onset  . Asthma Mother   . Hypertension Mother   . Hypertension Father     Social History   Tobacco Use  . Smoking status: Current Every Day Smoker    Years: 14.00    Types: Cigars  . Smokeless tobacco: Never Used  . Tobacco comment: 1 cigar daily  Vaping Use  . Vaping Use: Never used  Substance Use Topics  . Alcohol use: Yes    Comment: occ.  . Drug use: No    Home Medications Prior to Admission medications   Medication Sig Start Date End Date Taking? Authorizing Provider  albuterol (PROAIR HFA) 108 (90 Base) MCG/ACT inhaler 2 puffs every 4 hours as needed only  if your can't catch your breath 04/29/21   Raspet, Denny Peon K, PA-C  famotidine (PEPCID) 20 MG tablet One after supper 02/17/20   Nyoka Cowden, MD   pantoprazole (PROTONIX) 40 MG tablet Take 1 tablet (40 mg total) by mouth daily. Take 30-60 min before first meal of the day 02/17/20   Nyoka Cowden, MD  promethazine-dextromethorphan (PROMETHAZINE-DM) 6.25-15 MG/5ML syrup Take 5 mLs by mouth 3 (three) times daily as needed for cough. 04/29/21   Raspet, Noberto Retort, PA-C    Allergies    Patient has no known allergies.  Review of Systems   Review of Systems  Constitutional: Positive for chills, fatigue and fever. Negative for diaphoresis.  HENT: Positive for congestion and rhinorrhea.   Eyes: Negative for visual disturbance.  Respiratory: Positive for cough, chest tightness and shortness of breath. Negative for wheezing.   Cardiovascular: Negative for chest pain, palpitations and leg swelling.  Gastrointestinal: Negative for constipation, diarrhea and nausea.  Genitourinary: Positive for decreased urine volume. Negative for frequency.  Musculoskeletal: Positive for myalgias.  Skin: Negative for wound.  Neurological: Negative for dizziness, weakness, light-headedness and headaches.  Psychiatric/Behavioral: Negative for agitation.  All other systems reviewed and are negative.   Physical Exam Updated Vital Signs BP (!) 148/83   Pulse 66   Temp (!) 102 F (38.9 C) Comment: MD notified  Resp (!) 31   Ht 5\' 10"  (1.778 m)   Wt 99.8 kg   SpO2 99%   BMI 31.57 kg/m   Physical Exam Vitals and nursing  note reviewed.  Constitutional:      General: He is not in acute distress.    Appearance: He is well-developed. He is not ill-appearing, toxic-appearing or diaphoretic.  HENT:     Head: Normocephalic and atraumatic.     Nose: No congestion or rhinorrhea.     Mouth/Throat:     Mouth: Mucous membranes are dry.     Pharynx: No oropharyngeal exudate or posterior oropharyngeal erythema.  Eyes:     Conjunctiva/sclera: Conjunctivae normal.  Cardiovascular:     Rate and Rhythm: Normal rate and regular rhythm.     Heart sounds: No murmur  heard.   Pulmonary:     Effort: Pulmonary effort is normal. No respiratory distress.     Breath sounds: Normal breath sounds. No wheezing, rhonchi or rales.  Chest:     Chest wall: No tenderness.  Abdominal:     General: Abdomen is flat.     Palpations: Abdomen is soft.     Tenderness: There is no abdominal tenderness. There is no guarding or rebound.  Musculoskeletal:        General: No tenderness.     Cervical back: Neck supple. No tenderness.     Right lower leg: No edema.     Left lower leg: No edema.  Skin:    General: Skin is warm and dry.     Capillary Refill: Capillary refill takes less than 2 seconds.     Findings: No erythema.  Neurological:     General: No focal deficit present.     Mental Status: He is alert and oriented to person, place, and time. Mental status is at baseline.     ED Results / Procedures / Treatments   Labs (all labs ordered are listed, but only abnormal results are displayed) Labs Reviewed  CBC WITH DIFFERENTIAL/PLATELET - Abnormal; Notable for the following components:      Result Value   Platelets 142 (*)    All other components within normal limits  COMPREHENSIVE METABOLIC PANEL - Abnormal; Notable for the following components:   Sodium 134 (*)    Glucose, Bld 138 (*)    Creatinine, Ser 1.46 (*)    Calcium 8.6 (*)    Albumin 3.1 (*)    AST 128 (*)    ALT 157 (*)    All other components within normal limits  LACTIC ACID, PLASMA - Abnormal; Notable for the following components:   Lactic Acid, Venous 2.2 (*)    All other components within normal limits  URINALYSIS, ROUTINE W REFLEX MICROSCOPIC - Abnormal; Notable for the following components:   Color, Urine AMBER (*)    Protein, ur 100 (*)    All other components within normal limits  RESP PANEL BY RT-PCR (FLU A&B, COVID) ARPGX2  URINE CULTURE  LACTIC ACID, PLASMA  TROPONIN I (HIGH SENSITIVITY)  TROPONIN I (HIGH SENSITIVITY)    EKG EKG Interpretation  Date/Time:  Sunday May 01 2021 08:35:05 EDT Ventricular Rate:  77 PR Interval:  166 QRS Duration: 86 QT Interval:  334 QTC Calculation: 378 R Axis:   63 Text Interpretation: Sinus rhythm No prior ECG for comparison. No STEMI Confirmed by Theda Belfastegeler, Chris ([id number]) on 05/01/2021 9:18:06 AM   Radiology DG Chest Portable 1 View  Result Date: 05/01/2021 CLINICAL DATA:  40 year old male with fevers chills, c cough, chest tightness, and shortness of breath. EXAM: PORTABLE CHEST - 1 VIEW COMPARISON:  02/17/2020 FINDINGS: The mediastinal contours are within normal limits. No cardiomegaly. The  lungs are clear bilaterally without evidence of focal consolidation, pleural effusion, or pneumothorax. No acute osseous abnormality. IMPRESSION: No acute cardiopulmonary process. Electronically Signed   By: Marliss Coots MD   On: 05/01/2021 09:04   US Abdomen Limited RUQ (LIVER/GB)  Result Date: 05/01/2021 CLINICAL DATA:  40 year old male with increased LFTs. EXAM: ULTRASOUND ABDOMEN LIMITED RIGHT UPPER QUADRANT COMPARISON:  None. FINDINGS: Gallbladder: No gallstones, pericholecystic fluid, or gallbladder wall thickening visualized. No sonographic Murphy sign noted by sonographer. Common bile duct: Diameter: 3.5 mm Liver: No focal lesion identified. Within normal limits in parenchymal echogenicity and echotexture. Portal vein is patent on color Doppler imaging with normal direction of blood flow towards the liver. Other: No perihepatic ascites. IMPRESSION: Normal sonographic appearance of the liver. No acute abnormality in the right upper quadrant. Marliss Coots, MD [facility] Radiology Electronically Signed   By: Marliss Coots MD   On: 05/01/2021 11:51    Procedures Procedures   Medications Ordered in ED Medications  sodium chloride 0.9 % bolus 500 mL (has no administration in time range)  prochlorperazine (COMPAZINE) injection 10 mg (has no administration in time range)   diphenhydrAMINE (BENADRYL) injection 50 mg (has no administration in time range)  sodium chloride 0.9 % bolus 1,000 mL (0 mLs Intravenous Stopped 05/01/21 1002)    ED Course  I have reviewed the triage vital signs and the nursing notes.  Pertinent labs & imaging results that were available during my care of the patient were reviewed by me and considered in my medical decision making (see chart for details).    MDM Rules/Calculators/A&P                          Darren Bartlett is a 40 y.o. male with a past medical history significant for asthma who presents with several days of shortness of breath, chest tightness, fevers, chills, cough, malaise, and myalgias.  Patient reports that he was in urgent care several days ago and was given inhaler for URI symptoms and had a negative COVID test at that time.  He reports he has had continued cough but is nonproductive.  Denies any chest pain reports chest tightness and shortness of breath.  He reports he is having aching and malaise all over.  Denies any constipation or diarrhea but reports his urine has been darker and less.  Reports no appetite and less intake.  Reports that since his symptoms have been persistent he came in for evaluation.  On exam, lungs are clear and chest is nontender.  Abdomen is nontender.  No focal tenderness present but he does have soreness in all his muscles.  Neck has no rigidity and was nontender in the midline.  No focal neurologic deficits.  Mucous membranes are dry.  Likely suspect he has a viral URI that could be COVID or influenza given the ongoing pandemic and resurgence of influenza in this area.  We will get a repeat COVID/flu test as well as get chest x-ray and labs.  We will hydrate given his dry mucous membranes and reported decreased oral intake.  We will make sure he does not have any UTI with his decreased and darkened urine.  We will ambulate with pulse oximeter to make sure he does not get hypoxic.  Given  his lack of chest pain or tachycardia on arrival, low suspicion for pulmonary embolism.  Anticipate reassessment after work-up to determine disposition.  Patient continues to  have malaise and fatigue and is still complaining of some headache.  No nuchal rigidity on exam.  No focal neurologic deficits.  No leukocytosis or any other concerning findings suggestive of meningitis at this time.  We will give a headache cocktail and reassess but  clinically I suspect he has a viral infection and patient agrees.  Patient will follow-up with his primary doctor to have repeat LFTs checked in the next several days as well as use the nausea medicine to help maintain his hydration at home.    Anticipate discharge after headache cocktail he is feeling better.  Final Clinical Impression(s) / ED Diagnoses Final diagnoses:  LFT elevation  Fever, unspecified fever cause  Nausea  Myalgia  Upper respiratory tract infection, unspecified type  Cough    Rx / DC Orders ED Discharge Orders         Ordered    prochlorperazine (COMPAZINE) 10 MG tablet  Every 8 hours PRN        05/01/21 1507          Clinical Impression: 1. Fever, unspecified fever cause   2. LFT elevation   3. Nausea   4. Myalgia   5. Upper respiratory tract infection, unspecified type   6. Cough     Disposition: Discharge  Condition: Good  I have discussed the results, Dx and Tx plan with the pt(& family if present). He/she/they expressed understanding and agree(s) with the plan. Discharge instructions discussed at great length. Strict return precautions discussed and pt &/or family have verbalized understanding of the instructions. No further questions at time of discharge.    New Prescriptions   PROCHLORPERAZINE (COMPAZINE) 10 MG TABLET    Take 1 tablet (10 mg total) by mouth every 8 (eight) hours as needed for nausea or vomiting.    Follow Up: Mliss Sax, MD [address] [place]  [address] [phone]     [facility] EMERGENCY DEPARTMENT [address] [id number] mc [place] [address] [phone]       Takeyah Wieman, Canary Brim, MD 05/01/21 [phone]

## 2021-05-01 NOTE — ED Notes (Signed)
Provider at bedside

## 2021-05-01 NOTE — ED Notes (Signed)
Pt transported to US

## 2021-05-01 NOTE — ED Notes (Signed)
Patient transported to Ultrasound 

## 2021-05-01 NOTE — Discharge Instructions (Signed)
Your history, exam, work-up today are consistent with a viral infection causing your multitude of symptoms.  Your liver function was elevated today, please follow with your primary doctor in several days to get this repeated.  The ultrasound did not show evidence of gallbladder disease or cholecystitis.  Your urine did not show infection and your lungs did not show pneumonia on chest x-ray.  Given your reassuring physical exam and lab work-up otherwise, we suspect a viral infection or other bacterial infections.  Please use the headache medicine/nausea medicine to help with your discomfort and follow-up with your primary doctor.  Please stay out of work for the next few days.  If any symptoms change or worsen acutely, please return to the nearest emergency department.

## 2021-05-01 NOTE — ED Notes (Signed)
Critical lab, lactic 2.2 called by lab, MD notified.

## 2021-05-01 NOTE — ED Triage Notes (Signed)
HA. SOB with fever since Thursday, high 103.

## 2021-05-02 ENCOUNTER — Other Ambulatory Visit: Payer: Self-pay

## 2021-05-02 ENCOUNTER — Emergency Department (HOSPITAL_COMMUNITY)
Admission: EM | Admit: 2021-05-02 | Discharge: 2021-05-02 | Disposition: A | Discharge status: Home / Self Care | Payer: Self-pay | Attending: Emergency Medicine | Admitting: Emergency Medicine

## 2021-05-02 ENCOUNTER — Encounter (HOSPITAL_COMMUNITY): Payer: Self-pay | Admitting: Emergency Medicine

## 2021-05-02 DIAGNOSIS — H6693 Otitis media, unspecified, bilateral: Secondary | ICD-10-CM | POA: Insufficient documentation

## 2021-05-02 DIAGNOSIS — R509 Fever, unspecified: Secondary | ICD-10-CM

## 2021-05-02 DIAGNOSIS — H6690 Otitis media, unspecified, unspecified ear: Secondary | ICD-10-CM

## 2021-05-02 DIAGNOSIS — F17290 Nicotine dependence, other tobacco product, uncomplicated: Secondary | ICD-10-CM | POA: Insufficient documentation

## 2021-05-02 DIAGNOSIS — M791 Myalgia, unspecified site: Secondary | ICD-10-CM | POA: Insufficient documentation

## 2021-05-02 LAB — URINE CULTURE: Culture: <10000 — AB

## 2021-05-02 LAB — COMPREHENSIVE METABOLIC PANEL
ALT: 118 U/L — ABNORMAL HIGH (ref 0–44)
AST: 50 U/L — ABNORMAL HIGH (ref 15–41)
Albumin: 3 g/dL — ABNORMAL LOW (ref 3.5–5.0)
Alkaline Phosphatase: 105 U/L (ref 38–126)
Anion gap: 10 (ref 5–15)
BUN: 7 mg/dL (ref 6–20)
CO2: 24 mmol/L (ref 22–32)
Calcium: 8.7 mg/dL — ABNORMAL LOW (ref 8.9–10.3)
Chloride: 99 mmol/L (ref 98–111)
Creatinine, Ser: 1.52 mg/dL — ABNORMAL HIGH (ref 0.61–1.24)
GFR, Estimated: 59 mL/min — ABNORMAL LOW (ref >60)
Glucose, Bld: 124 mg/dL — ABNORMAL HIGH (ref 70–99)
Potassium: 4 mmol/L (ref 3.5–5.1)
Sodium: 133 mmol/L — ABNORMAL LOW (ref 135–145)
Total Bilirubin: 0.7 mg/dL (ref 0.3–1.2)
Total Protein: 6.9 g/dL (ref 6.5–8.1)

## 2021-05-02 LAB — CBC WITH DIFFERENTIAL/PLATELET
Abs Immature Granulocytes: 0.06 10*3/uL (ref 0.00–0.07)
Basophils Absolute: 0 10*3/uL (ref 0.0–0.1)
Basophils Relative: 0 %
Eosinophils Absolute: 0 10*3/uL (ref 0.0–0.5)
Eosinophils Relative: 0 %
HCT: 42.4 % (ref 39.0–52.0)
Hemoglobin: 13.9 g/dL (ref 13.0–17.0)
Immature Granulocytes: 1 %
Lymphocytes Relative: 6 %
Lymphs Abs: 0.7 10*3/uL (ref 0.7–4.0)
MCH: 29.8 pg (ref 26.0–34.0)
MCHC: 32.8 g/dL (ref 30.0–36.0)
MCV: 90.8 fL (ref 80.0–100.0)
Monocytes Absolute: 1.3 10*3/uL — ABNORMAL HIGH (ref 0.1–1.0)
Monocytes Relative: 11 %
Neutro Abs: 10.4 10*3/uL — ABNORMAL HIGH (ref 1.7–7.7)
Neutrophils Relative %: 82 %
Platelets: 169 10*3/uL (ref 150–400)
RBC: 4.67 MIL/uL (ref 4.22–5.81)
RDW: 12.8 % (ref 11.5–15.5)
WBC: 12.6 10*3/uL — ABNORMAL HIGH (ref 4.0–10.5)
nRBC: 0 % (ref 0.0–0.2)

## 2021-05-02 MED ORDER — DOXYCYCLINE HYCLATE 100 MG PO CAPS: 100.0000 mg | ORAL_CAPSULE | Freq: Two times a day (BID) | ORAL | 0 refills | Status: AC

## 2021-05-02 MED ORDER — DOXYCYCLINE HYCLATE 100 MG PO TABS
100.0000 mg | ORAL_TABLET | Freq: Once | ORAL | Status: AC
  Administered 2021-05-02: 100 mg via ORAL
  Filled 2021-05-02: qty 1

## 2021-05-02 MED ORDER — ACETAMINOPHEN 325 MG PO TABS
650.0000 mg | ORAL_TABLET | Freq: Once | ORAL | Status: AC | PRN
  Administered 2021-05-02: 650 mg via ORAL
  Filled 2021-05-02: qty 2

## 2021-05-02 NOTE — Discharge Instructions (Signed)
Your history exam and work-up today are consistent with fever that I suspect is due to either [facility] spotted fever versus otitis media.  Your ears did show some redness and bulging in both ears.  Despite the lack of ear pain, I am concerned it could be infected.  Given [place] and the fevers you been having, I also want to cover you for possible [facility] spotted fever infection.  I spoke with our pharmacist who recommended doxycycline to cover both possible infections.  Based on your exam and lack of any headache, neck pain, neck stiffness, I still do not feel there is meningitis and we had the shared decision-making conversation together and decided not to pursue lumbar puncture at this time.  Please continue hydration and treating fever at home.  Please rest.  If any symptoms change or worsen, please go to nearest emergency department

## 2021-05-02 NOTE — ED Provider Notes (Signed)
[facility] Note   CSN: [id number] Arrival date & time: 05/02/21  1903     History Chief Complaint  Patient presents with  . Fever    Darren HeysLabrian J Bartlett is a 40 y.o. male.  The history is provided by the patient and medical records.  Fever Max temp prior to arrival:  103 Temp source:  Oral Severity:  Moderate Onset quality:  Gradual Timing:  Constant Progression:  Unchanged Chronicity:  Recurrent Relieved by:  Nothing Worsened by:  Nothing Ineffective treatments:  Acetaminophen Associated symptoms: chills and myalgias   Associated symptoms: no chest pain, no confusion, no congestion, no cough, no diarrhea, no dysuria, no ear pain, no headaches, no nausea, no rash, no rhinorrhea, no sore throat and no vomiting        History reviewed. No pertinent past medical history.  Patient Active Problem List   Diagnosis Date Noted  . Cough variant asthma vs UACS  02/17/2020  . Healthcare maintenance 11/17/2019  . Urine frequency 11/17/2019    Past Surgical History:  Procedure Laterality Date  . I & D EXTREMITY Right 12/01/2018   Procedure: IRRIGATION AND DEBRIDEMENT EXTREMITY;  Surgeon: Dominica SeverinGramig, William, MD;  Location: [facility] OR;  Service: Orthopedics;  Laterality: Right;  . KNEE SURGERY         Family History  Problem Relation Age of Onset  . Asthma Mother   . Hypertension Mother   . Hypertension Father     Social History   Tobacco Use  . Smoking status: Current Every Day Smoker    Years: 14.00    Types: Cigars  . Smokeless tobacco: Never Used  . Tobacco comment: 1 cigar daily  Vaping Use  . Vaping Use: Never used  Substance Use Topics  . Alcohol use: Yes    Comment: occ.  . Drug use: No    Home Medications Prior to Admission medications   Medication Sig Start Date End Date Taking? Authorizing Provider  doxycycline (VIBRAMYCIN) 100 MG capsule Take 1 capsule (100 mg total) by mouth 2 (two) times daily for 10  days. 05/02/21 05/12/21 Yes Berenis Corter, Canary Brimhristopher J, MD  acetaminophen (TYLENOL) 500 MG tablet Take 1,000 mg by mouth every 6 (six) hours as needed for mild pain.    [provider]  albuterol (PROAIR HFA) 108 (90 Base) MCG/ACT inhaler 2 puffs every 4 hours as needed only  if your can't catch your breath 04/29/21   Raspet, Denny PeonErin K, PA-C  famotidine (PEPCID) 20 MG tablet One after supper Patient not taking: Reported on 05/01/2021 02/17/20   Nyoka CowdenWert, Michael B, MD  famotidine (PEPCID) 20 MG tablet Take 20 mg by mouth daily as needed for heartburn or indigestion (OTC).    [provider]  ibuprofen (ADVIL) 200 MG tablet Take 800 mg by mouth every 6 (six) hours as needed for mild pain or fever.    [provider]  prochlorperazine (COMPAZINE) 10 MG tablet Take 1 tablet (10 mg total) by mouth every 8 (eight) hours as needed for nausea or vomiting. 05/01/21   Demtrius Rounds, Canary Brimhristopher J, MD  promethazine-dextromethorphan (PROMETHAZINE-DM) 6.25-15 MG/5ML syrup Take 5 mLs by mouth 3 (three) times daily as needed for cough. 04/29/21   Raspet, Noberto RetortErin K, PA-C    Allergies    Patient has no known allergies.  Review of Systems   Review of Systems  Constitutional: Positive for chills, fatigue and fever. Negative for diaphoresis.  HENT: Negative for congestion, ear pain, rhinorrhea and  sore throat.   Eyes: Negative for visual disturbance.  Respiratory: Negative for cough, choking, chest tightness, shortness of breath and wheezing.   Cardiovascular: Negative for chest pain and palpitations.  Gastrointestinal: Negative for abdominal pain, constipation, diarrhea, nausea and vomiting.  Genitourinary: Negative for dysuria, flank pain and frequency.  Musculoskeletal: Positive for myalgias. Negative for back pain, neck pain and neck stiffness.  Skin: Negative for rash and wound.  Neurological: Negative for dizziness, seizures, syncope, weakness, light-headedness, numbness and headaches.   Psychiatric/Behavioral: Negative for agitation and confusion.  All other systems reviewed and are negative.   Physical Exam Updated Vital Signs BP 123/75 (BP Location: Left Arm)   Pulse 73   Temp (!) 103 F (39.4 C) (Oral)   Resp 18   Ht 5\' 10"  (1.778 m)   Wt 99.8 kg   SpO2 100%   BMI 31.57 kg/m   Physical Exam Vitals and nursing note reviewed.  Constitutional:      General: He is not in acute distress.    Appearance: He is well-developed. He is not ill-appearing, toxic-appearing or diaphoretic.  HENT:     Head: Normocephalic and atraumatic.     Right Ear: Tympanic membrane is erythematous and bulging.     Left Ear: Tympanic membrane is erythematous and bulging.     Nose: No congestion or rhinorrhea.     Mouth/Throat:     Mouth: Mucous membranes are moist.     Pharynx: No oropharyngeal exudate or posterior oropharyngeal erythema.  Eyes:     Extraocular Movements: Extraocular movements intact.     Conjunctiva/sclera: Conjunctivae normal.     Pupils: Pupils are equal, round, and reactive to light.  Cardiovascular:     Rate and Rhythm: Normal rate and regular rhythm.     Pulses: Normal pulses.     Heart sounds: No murmur heard.   Pulmonary:     Effort: Pulmonary effort is normal. No respiratory distress.     Breath sounds: Normal breath sounds. No wheezing, rhonchi or rales.  Chest:     Chest wall: No tenderness.  Abdominal:     General: Abdomen is flat.     Palpations: Abdomen is soft.     Tenderness: There is no abdominal tenderness. There is no right CVA tenderness, left CVA tenderness, guarding or rebound.  Musculoskeletal:        General: No tenderness.     Cervical back: Neck supple. No rigidity or tenderness.     Right lower leg: No edema.     Left lower leg: No edema.  Skin:    General: Skin is warm and dry.     Capillary Refill: Capillary refill takes less than 2 seconds.     Findings: No erythema or rash.  Neurological:     General: No focal deficit  present.     Mental Status: He is alert. Mental status is at baseline.     Sensory: No sensory deficit.     Motor: No weakness.  Psychiatric:        Mood and Affect: Mood normal.     ED Results / Procedures / Treatments   Labs (all labs ordered are listed, but only abnormal results are displayed) Labs Reviewed  COMPREHENSIVE METABOLIC PANEL - Abnormal; Notable for the following components:      Result Value   Sodium 133 (*)    Glucose, Bld 124 (*)    Creatinine, Ser 1.52 (*)    Calcium 8.7 (*)    Albumin  3.0 (*)    AST 50 (*)    ALT 118 (*)    GFR, Estimated 59 (*)    All other components within normal limits  CBC WITH DIFFERENTIAL/PLATELET - Abnormal; Notable for the following components:   WBC 12.6 (*)    Neutro Abs 10.4 (*)    Monocytes Absolute 1.3 (*)    All other components within normal limits  ROCKY MTN SPOTTED FVR ABS PNL(IGG+IGM)    EKG None  Radiology DG Chest Portable 1 View  Result Date: 05/01/2021 CLINICAL DATA:  40 year old male with fevers chills, c cough, chest tightness, and shortness of breath. EXAM: PORTABLE CHEST - 1 VIEW COMPARISON:  02/17/2020 FINDINGS: The mediastinal contours are within normal limits. No cardiomegaly. The lungs are clear bilaterally without evidence of focal consolidation, pleural effusion, or pneumothorax. No acute osseous abnormality. IMPRESSION: No acute cardiopulmonary process. Electronically Signed   By: Marliss Coots MD   On: 05/01/2021 09:04   US Abdomen Limited RUQ (LIVER/GB)  Result Date: 05/01/2021 CLINICAL DATA:  40 year old male with increased LFTs. EXAM: ULTRASOUND ABDOMEN LIMITED RIGHT UPPER QUADRANT COMPARISON:  None. FINDINGS: Gallbladder: No gallstones, pericholecystic fluid, or gallbladder wall thickening visualized. No sonographic Murphy sign noted by sonographer. Common bile duct: Diameter: 3.5 mm Liver: No focal lesion identified. Within normal limits in parenchymal echogenicity and echotexture. Portal vein is  patent on color Doppler imaging with normal direction of blood flow towards the liver. Other: No perihepatic ascites. IMPRESSION: Normal sonographic appearance of the liver. No acute abnormality in the right upper quadrant. Marliss Coots, MD [facility] Electronically Signed   By: Marliss Coots MD   On: 05/01/2021 11:51    Procedures Procedures   Medications Ordered in ED Medications  acetaminophen (TYLENOL) tablet 650 mg (650 mg Oral Given 05/02/21 1948)  doxycycline (VIBRA-TABS) tablet 100 mg (100 mg Oral Given 05/02/21 2314)    ED Course  I have reviewed the triage vital signs and the nursing notes.  Pertinent labs & imaging results that were available during my care of the patient were reviewed by me and considered in my medical decision making (see chart for details).    MDM Rules/Calculators/A&P                          Darren Bartlett is a 40 y.o. male with a past medical history significant for asthma and recent visit for fever who presents with continued fever.  I saw this patient yesterday in this emergency department for similar symptoms of fever.  Patient says he is not having any headaches, neck pain, neck stiffness, rash, nausea, vomiting, constipation, diarrhea, chest pain, or cough.  He reported some shortness of yesterday but has not had any today.  Overall he feels well aside from the fevers that are making him feel malaise and fatigue.  He has been treating his fever at home with moderate success.  He is trying to stay hydrated.  His family convinced him to come back in as he is still febrile.  Yesterday, his work-up included a negative COVID and flu test, reassuring urine, and negative chest x-ray.  He denies any further symptoms.  We discussed the possibility yesterday about meningitis although after his headache cocktail his headache resolved.  We also discussed the possibly of recommend spotted fever given [place] and unexplained fevers at this time a year but patient is adamant that he  did not get exposed to a tick yesterday.  Patient was sent home for symptomatic management and then returned.  On exam, lungs are still clear and chest is nontender.  Abdomen is nontender.  No focal neurologic deficits.  No rash.  No rash in his mouth.   Patient did have erythematous and bulging TMs bilaterally although he was not describing ear pain.  No nuchal rigidity or any other abnormalities on exam.  Patient had some more screening labs in triage and the CBC and CMP are overall similar with improving LFTs but slightly more elevated white blood cell count.  Had a shared decision made conversation with patient about work-up today and he agreed that with his lack of new cough or new shortness of breath, he did not want a chest x-ray.  We discussed repeat urinalysis but given his lack of urinary changes he agreed to the urine.  We talked about lumbar puncture but given his lack of any headache, neck pain, neck stiffness, he does not want lumbar puncture.  Patient is amenable to getting started on doxycycline to treat empirically for possible RMSF and we will send an RMSF test off.  We will also treat him for otitis media despite lack of pain in his ears TMs were bulging and erythematous bilaterally.  I spoke with pharmacy who felt that doxycycline was appropriate to treat both presumptive RMSF and the otitis media.  Patient agrees with plan of care and other questions or concerns.  He was discharged in good condition with understanding return precautions and with full agreement and diagnostic work-up today and management plan.   Final Clinical Impression(s) / ED Diagnoses Final diagnoses:  Fever, unspecified fever cause  Acute otitis media, unspecified otitis media type    Rx / DC Orders ED Discharge Orders         Ordered    doxycycline (VIBRAMYCIN) 100 MG capsule  2 times daily        05/02/21 2238            Analyse Angst, Canary Brim, MD 05/02/21 [phone]

## 2021-05-02 NOTE — ED Provider Notes (Signed)
Emergency Medicine Provider Triage Evaluation Note  Darren Bartlett , a 40 y.o. male  was evaluated in triage.  Pt complains of fevers ongoing for 4 days. Denies rhinorrhea, congestion, sore throat, nvd, urinary sxs, chest pain. Has some sob when he has a fever.   Review of Systems  Positive: fever Negative: rhinorrhea, congestion, sore throat, nvd, urinary sxs, chest pain  Physical Exam  BP (!) 149/94 (BP Location: Left Arm)   Pulse 87   Temp (!) 103.3 F (39.6 C)   Resp 20   Ht 5\' 10"  (1.778 m)   Wt 99.8 kg   SpO2 100%   BMI 31.57 kg/m  Gen:   Awake, no distress   Resp:  Normal effort  MSK:   Moves extremities without difficulty   Medical Decision Making  Medically screening exam initiated at 7:33 PM.  Appropriate orders placed.  DAVIAN HANSHAW was informed that the remainder of the evaluation will be completed by another provider, this initial triage assessment does not replace that evaluation, and the importance of remaining in the ED until their evaluation is complete.    Blair Heys 05/02/21 1935    Tegeler, 05/04/21, MD 05/02/21 [phone]

## 2021-05-02 NOTE — ED Triage Notes (Addendum)
Pt c/o ongoing fever unable control with tylenol. Decreased intake since Thursday. Diagnosed with upper respiratory infection. Denies NVD, CP.   Last tylenol at 1530

## 2021-05-10 LAB — ROCKY MTN SPOTTED FVR ABS PNL(IGG+IGM)
RMSF IgG: NEGATIVE
RMSF IgM: 0.63 {index} (ref 0.00–0.89)

## 2023-04-03 ENCOUNTER — Telehealth: Payer: Self-pay

## 2023-04-03 NOTE — Telephone Encounter (Signed)
Attempted to contact patient. No working #. AS, CMA 

## 2023-04-04 ENCOUNTER — Ambulatory Visit (HOSPITAL_COMMUNITY)
Admission: EM | Admit: 2023-04-04 | Discharge: 2023-04-04 | Disposition: A | Discharge status: Home / Self Care | Payer: No Typology Code available for payment source | Attending: Physician Assistant | Admitting: Physician Assistant

## 2023-04-04 ENCOUNTER — Ambulatory Visit (HOSPITAL_COMMUNITY): Payer: No Typology Code available for payment source

## 2023-04-04 ENCOUNTER — Encounter (HOSPITAL_COMMUNITY): Payer: Self-pay

## 2023-04-04 DIAGNOSIS — Z1152 Encounter for screening for COVID-19: Secondary | ICD-10-CM | POA: Diagnosis not present

## 2023-04-04 DIAGNOSIS — R059 Cough, unspecified: Secondary | ICD-10-CM | POA: Diagnosis not present

## 2023-04-04 DIAGNOSIS — J309 Allergic rhinitis, unspecified: Secondary | ICD-10-CM | POA: Insufficient documentation

## 2023-04-04 DIAGNOSIS — R051 Acute cough: Secondary | ICD-10-CM

## 2023-04-04 DIAGNOSIS — J3089 Other allergic rhinitis: Secondary | ICD-10-CM | POA: Diagnosis not present

## 2023-04-04 LAB — POCT INFLUENZA A/B
Influenza A, POC: NEGATIVE
Influenza B, POC: NEGATIVE

## 2023-04-04 MED ORDER — ALBUTEROL SULFATE HFA 108 (90 BASE) MCG/ACT IN AERS: 2.0000 | INHALATION_SPRAY | Freq: Four times a day (QID) | RESPIRATORY_TRACT | 0 refills | Status: DC | PRN

## 2023-04-04 NOTE — ED Provider Notes (Addendum)
[facility]    CSN: [id number] Arrival date & time: 04/04/23  1630      History   Chief Complaint Chief Complaint  Patient presents with   Cough    HPI Darren Bartlett is a 42 y.o. male.   HPI  URI- type symptoms   Onset: gradual  Duration: ongoing for about 2 months  Associated symptoms:reports nasal congestion, productive cough with yellow mucus, headaches, sneezing, gagging while brushing teeth, sinus pressure  Reports chills about 2 weeks ago but this has resolved   Interventions: nasal lavage, mucinex nasal spray, acid reflux medications, Claritin PRN, cold medications about 2 weeks ago   Recent sick contacts: none to his knowledge  Recent travel: none     History reviewed. No pertinent past medical history.  Patient Active Problem List   Diagnosis Date Noted   Cough variant asthma vs UACS  02/17/2020   Healthcare maintenance 11/17/2019   Urine frequency 11/17/2019    Past Surgical History:  Procedure Laterality Date   I & D EXTREMITY Right 12/01/2018   Procedure: IRRIGATION AND DEBRIDEMENT EXTREMITY;  Surgeon: Dominica Severin, MD;  Location: [facility] OR;  Service: Orthopedics;  Laterality: Right;   KNEE SURGERY         Home Medications    Prior to Admission medications   Medication Sig Start Date End Date Taking? Authorizing Provider  albuterol (VENTOLIN HFA) 108 (90 Base) MCG/ACT inhaler Inhale 2 puffs into the lungs every 6 (six) hours as needed for wheezing or shortness of breath. 04/04/23  Yes Angalina Ante E, PA-C  acetaminophen (TYLENOL) 500 MG tablet Take 1,000 mg by mouth every 6 (six) hours as needed for mild pain.    [provider]  famotidine (PEPCID) 20 MG tablet One after supper Patient not taking: Reported on 05/01/2021 02/17/20   Nyoka Cowden, MD  famotidine (PEPCID) 20 MG tablet Take 20 mg by mouth daily as needed for heartburn or indigestion (OTC).    [provider]  ibuprofen (ADVIL) 200 MG tablet Take 800 mg  by mouth every 6 (six) hours as needed for mild pain or fever.    [provider]    Family History Family History  Problem Relation Age of Onset   Asthma Mother    Hypertension Mother    Hypertension Father     Social History Social History   Tobacco Use   Smoking status: Every Day    Types: Cigars   Smokeless tobacco: Never   Tobacco comments:    1 cigar daily  Vaping Use   Vaping Use: Never used  Substance Use Topics   Alcohol use: Yes    Comment: occ.   Drug use: No     Allergies   Patient has no known allergies.   Review of Systems Review of Systems  Constitutional:  Positive for chills and fatigue. Negative for fever.  HENT:  Positive for congestion, postnasal drip, sinus pressure, sinus pain, sneezing and sore throat. Negative for ear discharge and ear pain (ear itching).   Eyes:  Positive for discharge and itching.  Respiratory:  Positive for cough, shortness of breath and wheezing.   Gastrointestinal:  Negative for diarrhea, nausea and vomiting.  Musculoskeletal:  Negative for myalgias.  Neurological:  Positive for headaches.     Physical Exam Triage Vital Signs ED Triage Vitals  Enc Vitals Group     BP 04/04/23 1705 (!) 149/94     Pulse Rate 04/04/23 1705 70  Resp 04/04/23 1705 18     Temp 04/04/23 1705 98.5 F (36.9 C)     Temp Source 04/04/23 1705 Oral     SpO2 04/04/23 1705 97 %     Weight --      Height --      Head Circumference --      Peak Flow --      Pain Score 04/04/23 1706 0     Pain Loc --      Pain Edu? --      Excl. in GC? --    No data found.  Updated Vital Signs BP (!) 149/94 (BP Location: Right Arm)   Pulse 70   Temp 98.5 F (36.9 C) (Oral)   Resp 18   SpO2 97%   Visual Acuity Right Eye Distance:   Left Eye Distance:   Bilateral Distance:    Right Eye Near:   Left Eye Near:    Bilateral Near:     Physical Exam Vitals reviewed.  Constitutional:      General: He is awake.     Appearance:  Normal appearance. He is well-developed and well-groomed.  HENT:     Head: Normocephalic and atraumatic.     Right Ear: Hearing and ear canal normal. A middle ear effusion is present.     Left Ear: Hearing and ear canal normal. A middle ear effusion is present.     Nose: Congestion present.     Right Turbinates: Swollen.     Left Turbinates: Swollen.     Mouth/Throat:     Lips: Pink.     Mouth: Mucous membranes are moist.     Pharynx: Oropharynx is clear. Uvula midline. No oropharyngeal exudate or posterior oropharyngeal erythema.     Tonsils: No tonsillar exudate or tonsillar abscesses.  Eyes:     General: Lids are normal. Gaze aligned appropriately.     Extraocular Movements: Extraocular movements intact.     Conjunctiva/sclera: Conjunctivae normal.     Pupils: Pupils are equal, round, and reactive to light.  Cardiovascular:     Rate and Rhythm: Normal rate and regular rhythm.     Pulses: Normal pulses.     Heart sounds: Normal heart sounds. No murmur heard.    No friction rub. No gallop.  Pulmonary:     Effort: Pulmonary effort is normal.     Breath sounds: Normal breath sounds. No decreased air movement. No decreased breath sounds, wheezing, rhonchi or rales.  Musculoskeletal:     Cervical back: Normal range of motion and neck supple. Normal range of motion.  Lymphadenopathy:     Head:     Right side of head: No submental, submandibular or preauricular adenopathy.     Left side of head: No submental, submandibular or preauricular adenopathy.     Cervical:     Right cervical: No superficial or posterior cervical adenopathy.    Left cervical: No superficial or posterior cervical adenopathy.     Upper Body:     Right upper body: No supraclavicular adenopathy.     Left upper body: No supraclavicular adenopathy.  Neurological:     Mental Status: He is alert.  Psychiatric:        Behavior: Behavior is cooperative.      UC Treatments / Results  Labs (all labs ordered are  listed, but only abnormal results are displayed) Labs Reviewed  SARS CORONAVIRUS 2 (TAT 6-24 HRS)  POCT INFLUENZA A/B    EKG   Radiology  DG Chest 2 View  Result Date: 04/04/2023 CLINICAL DATA:  Productive cough. EXAM: CHEST - 2 VIEW COMPARISON:  Chest radiograph dated 05/01/2021. FINDINGS: The heart size and mediastinal contours are within normal limits. Both lungs are clear. The visualized skeletal structures are unremarkable. IMPRESSION: No active cardiopulmonary disease. Electronically Signed   By: Elgie Collard M.D.   On: 04/04/2023 17:47    Procedures Procedures (including critical care time)  Medications Ordered in UC Medications - No data to display  Initial Impression / Assessment and Plan / UC Course  I have reviewed the triage vital signs and the nursing notes.  Pertinent labs & imaging results that were available during my care of the patient were reviewed by me and considered in my medical decision making (see chart for details).    Problem List Items Addressed This Visit   None Visit Diagnoses     Allergic rhinitis, unspecified seasonality, unspecified trigger    -  Primary Acute, likely recurrent concern Flu testing was negative, awaiting COVID results-results to dictate further management, patient reports that symptoms have been ongoing for longer than 5 days so antivirals would likely not be required if positive Chest x-ray results reviewed, demonstrates no active cardiopulmonary disease Reviewed  Flu and chest x-ray results with patient during appointment Given history of current symptoms suspect that he is likely suffering from allergic rhinitis, seasonal allergies and will treat accordingly Recommend daily antihistamine of choice, Flonase daily Will also provide albuterol rescue inhaler to assist with wheezing and perceived shortness of breath Reviewed viral versus bacterial versus allergic etiology of current symptoms with patient Follow-up as needed for  persistent or progressing symptoms   Environmental and seasonal allergies       Acute cough             Final Clinical Impressions(s) / UC Diagnoses   Final diagnoses:  Allergic rhinitis, unspecified seasonality, unspecified trigger  Environmental and seasonal allergies  Acute cough     Discharge Instructions      At this time I think you are likely having an allergy flare - this is a common time for a lot of people to have worsening allergy symptoms in [place] I would recommend the following:  Taking a daily antihistamine such as Claritin, Allegra, Zyrtec - the generics of these work just as good as the brand names I would recommend taking one of these at night if they cause drowsiness  I recommend using Flonase nasal spray- two sprays in each nostril twice per day to assist with the congestion and pressure  It can take some time for these measures to provide relief so please be consistent and patient while using them.   I have sent in an Albuterol inhaler for you to use to help with your breathing and wheezing. You can use this up to every 6 hours as needed  You can also try using nasal saline flushes and Neti pot with boiled, cooled water or distilled water - NO TAP WATER       ED Prescriptions     Medication Sig Dispense Auth. Provider   albuterol (VENTOLIN HFA) 108 (90 Base) MCG/ACT inhaler Inhale 2 puffs into the lungs every 6 (six) hours as needed for wheezing or shortness of breath. 18 g Itzae Miralles E, PA-C      PDMP not reviewed this encounter.   Rashan Rounsaville, Oswaldo Conroy, PA-C 04/04/23 1824    MecumOswaldo Conroy, PA-C 04/04/23 1825

## 2023-04-04 NOTE — Discharge Instructions (Addendum)
At this time I think you are likely having an allergy flare - this is a common time for a lot of people to have worsening allergy symptoms in [place] I would recommend the following:  Taking a daily antihistamine such as Claritin, Allegra, Zyrtec - the generics of these work just as good as the brand names I would recommend taking one of these at night if they cause drowsiness  I recommend using Flonase nasal spray- two sprays in each nostril twice per day to assist with the congestion and pressure  It can take some time for these measures to provide relief so please be consistent and patient while using them.   I have sent in an Albuterol inhaler for you to use to help with your breathing and wheezing. You can use this up to every 6 hours as needed  You can also try using nasal saline flushes and Neti pot with boiled, cooled water or distilled water - NO TAP WATER

## 2023-04-04 NOTE — ED Triage Notes (Signed)
Pt c/o productive cough with yellow sputum, nasal/chest congestion, face pressure, and sneezing for the past 2 months. States taking allergy med, nasal flushes, cough meds, and acid relux meds with no relief.

## 2023-04-05 LAB — SARS CORONAVIRUS 2 (TAT 6-24 HRS): SARS Coronavirus 2: NEGATIVE

## 2023-04-18 ENCOUNTER — Other Ambulatory Visit (HOSPITAL_COMMUNITY): Payer: Self-pay | Admitting: Family Medicine

## 2023-04-18 DIAGNOSIS — K219 Gastro-esophageal reflux disease without esophagitis: Secondary | ICD-10-CM

## 2023-04-25 ENCOUNTER — Ambulatory Visit (HOSPITAL_COMMUNITY): Payer: No Typology Code available for payment source

## 2023-04-25 ENCOUNTER — Encounter (HOSPITAL_COMMUNITY): Payer: Self-pay

## 2023-06-22 DIAGNOSIS — R0981 Nasal congestion: Secondary | ICD-10-CM | POA: Diagnosis not present

## 2023-06-22 DIAGNOSIS — J309 Allergic rhinitis, unspecified: Secondary | ICD-10-CM | POA: Diagnosis not present

## 2023-07-17 ENCOUNTER — Encounter (HOSPITAL_COMMUNITY): Payer: Self-pay

## 2023-07-17 ENCOUNTER — Ambulatory Visit (HOSPITAL_COMMUNITY)
Admission: EM | Admit: 2023-07-17 | Discharge: 2023-07-17 | Disposition: A | Discharge status: Home / Self Care | Payer: No Typology Code available for payment source | Attending: Internal Medicine | Admitting: Internal Medicine

## 2023-07-17 DIAGNOSIS — R369 Urethral discharge, unspecified: Secondary | ICD-10-CM | POA: Diagnosis not present

## 2023-07-17 LAB — HIV ANTIBODY (ROUTINE TESTING W REFLEX): HIV Screen 4th Generation wRfx: NONREACTIVE

## 2023-07-17 NOTE — ED Provider Notes (Signed)
[facility]    CSN: [id number] Arrival date & time: 07/17/23  1504      History   Chief Complaint Chief Complaint  Patient presents with   SEXUALLY TRANSMITTED DISEASE    HPI Darren Bartlett is a 42 y.o. male.   Patient presents to urgent care for evaluation of penile discharge and possible exposure to STD.  Penile discharge started 3 to 4 days ago and is intermittent/white in color.  No odor, dysuria, penile itching, penile rash, N/V/D, abdominal pain, fever/chills, or urinary symptoms.  Recently participated in protected intercourse with a condom but the condom broke during intercourse.  He replaced condom during intercourse, however is still concerned that he may have been exposed to STD given new symptoms.  He would like to be tested for all STDs.  No known exposure to STD.     History reviewed. No pertinent past medical history.  Patient Active Problem List   Diagnosis Date Noted   Cough variant asthma vs UACS  02/17/2020   Healthcare maintenance 11/17/2019   Urine frequency 11/17/2019    Past Surgical History:  Procedure Laterality Date   I & D EXTREMITY Right 12/01/2018   Procedure: IRRIGATION AND DEBRIDEMENT EXTREMITY;  Surgeon: Dominica Severin, MD;  Location: [facility];  Service: Orthopedics;  Laterality: Right;   KNEE SURGERY         Home Medications    Prior to Admission medications   Medication Sig Start Date End Date Taking? Authorizing Provider  albuterol (VENTOLIN HFA) 108 (90 Base) MCG/ACT inhaler Inhale 2 puffs into the lungs every 6 (six) hours as needed for wheezing or shortness of breath. 04/04/23  Yes Mecum, Erin E, PA-C  famotidine (PEPCID) 20 MG tablet Take 20 mg by mouth daily as needed for heartburn or indigestion (OTC).   Yes [provider]  acetaminophen (TYLENOL) 500 MG tablet Take 1,000 mg by mouth every 6 (six) hours as needed for mild pain.    [provider]  famotidine (PEPCID) 20 MG tablet One after  supper Patient not taking: Reported on 05/01/2021 02/17/20   Nyoka Cowden, MD  ibuprofen (ADVIL) 200 MG tablet Take 800 mg by mouth every 6 (six) hours as needed for mild pain or fever.    [provider]    Family History Family History  Problem Relation Age of Onset   Asthma Mother    Hypertension Mother    Hypertension Father     Social History Social History   Tobacco Use   Smoking status: Every Day    Types: Cigars   Smokeless tobacco: Never   Tobacco comments:    1 cigar daily  Vaping Use   Vaping status: Never Used  Substance Use Topics   Alcohol use: Yes    Comment: occ.   Drug use: No     Allergies   Patient has no known allergies.   Review of Systems Review of Systems Per HPI  Physical Exam Triage Vital Signs ED Triage Vitals [07/17/23 1521]  Encounter Vitals Group     BP 134/88     Systolic BP Percentile      Diastolic BP Percentile      Pulse Rate 72     Resp 16     Temp 98.4 F (36.9 C)     Temp Source Oral     SpO2 97 %     Weight 250 lb (113.4 kg)     Height 5\' 10"  (1.778 m)  Head Circumference      Peak Flow      Pain Score 0     Pain Loc      Pain Education      Exclude from Growth Chart    No data found.  Updated Vital Signs BP 134/88 (BP Location: Left Arm)   Pulse 72   Temp 98.4 F (36.9 C) (Oral)   Resp 16   Ht 5\' 10"  (1.778 m)   Wt 250 lb (113.4 kg)   SpO2 97%   BMI 35.87 kg/m   Visual Acuity Right Eye Distance:   Left Eye Distance:   Bilateral Distance:    Right Eye Near:   Left Eye Near:    Bilateral Near:     Physical Exam Vitals and nursing note reviewed.  Constitutional:      Appearance: He is not ill-appearing or toxic-appearing.  HENT:     Head: Normocephalic and atraumatic.     Right Ear: Hearing and external ear normal.     Left Ear: Hearing and external ear normal.     Nose: Nose normal.     Mouth/Throat:     Lips: Pink.  Eyes:     General: Lids are normal. Vision grossly  intact. Gaze aligned appropriately.     Extraocular Movements: Extraocular movements intact.     Conjunctiva/sclera: Conjunctivae normal.  Pulmonary:     Effort: Pulmonary effort is normal.  Genitourinary:    Comments: Deferred. Musculoskeletal:     Cervical back: Neck supple.  Skin:    General: Skin is warm and dry.     Capillary Refill: Capillary refill takes less than 2 seconds.     Findings: No rash.  Neurological:     General: No focal deficit present.     Mental Status: He is alert and oriented to person, place, and time. Mental status is at baseline.     Cranial Nerves: No dysarthria or facial asymmetry.  Psychiatric:        Mood and Affect: Mood normal.        Speech: Speech normal.        Behavior: Behavior normal.        Thought Content: Thought content normal.        Judgment: Judgment normal.      UC Treatments / Results  Labs (all labs ordered are listed, but only abnormal results are displayed) Labs Reviewed  HIV ANTIBODY (ROUTINE TESTING W REFLEX)  RPR  CYTOLOGY, (ORAL, ANAL, URETHRAL) ANCILLARY ONLY    EKG   Radiology No results found.  Procedures Procedures (including critical care time)  Medications Ordered in UC Medications - No data to display  Initial Impression / Assessment and Plan / UC Course  I have reviewed the triage vital signs and the nursing notes.  Pertinent labs & imaging results that were available during my care of the patient were reviewed by me and considered in my medical decision making (see chart for details).   1.  Penile discharge STI labs pending, will notify patient of positive results and treat accordingly per protocol when labs result.  Patient agrees to HIV and syphilis testing today.   Patient to avoid sexual intercourse until screening testing comes back.   Education provided regarding safe sexual practices and patient encouraged to use protection to prevent spread of STIs.   Counseled patient on potential for  adverse effects with medications prescribed/recommended today, strict ER and return-to-clinic precautions discussed, patient verbalized understanding.  Final Clinical Impressions(s) / UC Diagnoses   Final diagnoses:  Penile discharge     Discharge Instructions      STD testing pending, this will take 2-3 days to result. We will only call you if your testing is positive for any infection(s) and we will provide treatment.  Avoid sexual intercourse until your STD results come back.  If any of your STD results are positive, you will need to avoid sexual intercourse for 7 days while you are being treated to prevent spread of STD.  Condom use is the best way to prevent spread of STDs.  Return to urgent care as needed.      ED Prescriptions   None    PDMP not reviewed this encounter.   Carlisle Beers, [place] 07/17/23 1606

## 2023-07-17 NOTE — ED Triage Notes (Signed)
Patient here today with c/o penile discharge and tingling sensation at the tip of the penis X 3-4 days.

## 2023-07-17 NOTE — Discharge Instructions (Signed)
 STD testing pending, this will take 2-3 days to result. We will only call you if your testing is positive for any infection(s) and we will provide treatment.  Avoid sexual intercourse until your STD results come back.  If any of your STD results are positive, you will need to avoid sexual intercourse for 7 days while you are being treated to prevent spread of STD.  Condom use is the best way to prevent spread of STDs.  Return to urgent care as needed.

## 2023-08-14 DIAGNOSIS — J309 Allergic rhinitis, unspecified: Secondary | ICD-10-CM | POA: Diagnosis not present

## 2023-09-27 ENCOUNTER — Ambulatory Visit: Payer: No Typology Code available for payment source | Admitting: Allergy

## 2023-09-27 ENCOUNTER — Encounter: Payer: Self-pay | Admitting: Allergy

## 2023-09-27 ENCOUNTER — Other Ambulatory Visit: Payer: Self-pay

## 2023-09-27 VITALS — BP 138/88 | HR 76 | Temp 98.1°F | Resp 18 | Ht 68.9 in | Wt 255.5 lb

## 2023-09-27 DIAGNOSIS — J4520 Mild intermittent asthma, uncomplicated: Secondary | ICD-10-CM

## 2023-09-27 DIAGNOSIS — J310 Chronic rhinitis: Secondary | ICD-10-CM | POA: Diagnosis not present

## 2023-09-27 DIAGNOSIS — H109 Unspecified conjunctivitis: Secondary | ICD-10-CM | POA: Diagnosis not present

## 2023-09-27 MED ORDER — ALBUTEROL SULFATE HFA 108 (90 BASE) MCG/ACT IN AERS: 2.0000 | INHALATION_SPRAY | Freq: Four times a day (QID) | RESPIRATORY_TRACT | 1 refills | Status: AC | PRN

## 2023-09-27 MED ORDER — RYALTRIS 665-25 MCG/ACT NA SUSP: 2.0000 | Freq: Two times a day (BID) | NASAL | 5 refills | Status: AC

## 2023-09-27 MED ORDER — LEVOCETIRIZINE DIHYDROCHLORIDE 5 MG PO TABS: 5.0000 mg | ORAL_TABLET | Freq: Every evening | ORAL | 5 refills | Status: AC

## 2023-09-27 NOTE — Progress Notes (Signed)
New Patient Note  RE: Darren Bartlett MRN: [id number] DOB: 14-Oct-1981 Date of Office Visit: 09/27/2023  Primary care provider: Irven Coe, MD  Chief Complaint: congestion, nasal drip, breathing problems  History of present illness: Darren Bartlett is a 42 y.o. male presenting today for evaluation of allergic rhinitis.   Discussed the use of AI scribe software for clinical note transcription with the patient, who gave verbal consent to proceed.  The patient, under the primary care of Dr. Lewie Chamber, presents with two primary concerns: allergies and breathing difficulties. He reports year-round allergy symptoms, including nasal congestion, post-nasal drip, frequent sneezing, and occasional watery eyes. The patient also experiences sinus pressure, leading to frequent temple massages, and occasional itchy ears. These symptoms seem to be slightly worse during the summer months, but are present throughout the year. The patient has been managing these symptoms with Afrin nasal spray daily at this time and has also used Mucinex nasal spray.  He states he has used claritin in the past and does feel it was helpful.  He has been using a nasal rinse device once a month, which he reports helps with his symptoms.  In addition to the allergy symptoms, the patient reports breathing difficulties, particularly when overheated. These episodes are not triggered by exercise, but rather seem to be linked to sinus issues. The patient has never been formally diagnosed with asthma, but has used his mother's inhaler in the past, which he reports helps to alleviate his symptoms. The use of the inhaler is infrequent, only a couple of times a month or every other month.  The patient also reports occasional chest tightness, which he describes as feeling "blocked up." This tightness sometimes leads to coughing up mucus.   The patient denies any history of eczema, food allergies.  He does have history of reflux for which he uses  pepcid with control.      Review of systems: 10pt ROS negative unless noted above in HPI  All other systems negative unless noted above in HPI  Past medical history: History reviewed. No pertinent past medical history.  Past surgical history: Past Surgical History:  Procedure Laterality Date   I & D EXTREMITY Right 12/01/2018   Procedure: IRRIGATION AND DEBRIDEMENT EXTREMITY;  Surgeon: Dominica Severin, MD;  Location: [facility];  Service: Orthopedics;  Laterality: Right;   KNEE SURGERY      Family history:  Family History  Problem Relation Age of Onset   Asthma Mother    Hypertension Mother    Hypertension Father     Social history: Lives in a home with carpeting in the bedroom with electric heating and central cooling.  Dog in the home.  There is no concern for water damage, mildew or roaches in the home.  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  He does report a smoking history of cigars about 1 a week.   Medication List: Current Outpatient Medications  Medication Sig Dispense Refill   famotidine (PEPCID) 20 MG tablet One after supper 30 tablet 11   famotidine (PEPCID) 20 MG tablet Take 20 mg by mouth daily as needed for heartburn or indigestion (OTC).     acetaminophen (TYLENOL) 500 MG tablet Take 1,000 mg by mouth every 6 (six) hours as needed for mild pain. (Patient not taking: Reported on 09/27/2023)     albuterol (VENTOLIN HFA) 108 (90 Base) MCG/ACT inhaler Inhale 2 puffs into the lungs every 6 (six) hours as needed for wheezing or shortness of  breath. (Patient not taking: Reported on 09/27/2023) 18 g 0   ibuprofen (ADVIL) 200 MG tablet Take 800 mg by mouth every 6 (six) hours as needed for mild pain or fever. (Patient not taking: Reported on 09/27/2023)     No current facility-administered medications for this visit.    Known medication allergies: No Known Allergies   Physical examination: Blood pressure 138/88, pulse 76, temperature 98.1 F (36.7 C),  temperature source Temporal, resp. rate 18, height 5' 8.9" (1.75 m), weight 255 lb 8 oz (115.9 kg), SpO2 97%.  General: Alert, interactive, in no acute distress. HEENT: PERRLA, TMs pearly gray, turbinates markedly edematous without discharge, post-pharynx non erythematous. Neck: Supple without lymphadenopathy. Lungs: Mildly decreased breath sounds bilaterally without wheezing, rhonchi or rales. {no increased work of breathing. CV: Normal S1, S2 without murmurs. Abdomen: Nondistended, nontender. Skin: Warm and dry, without lesions or rashes. Extremities:  No clubbing, cyanosis or edema. Neuro:   Grossly intact.  Diagnositics/Labs:  Spirometry: FEV1: 2.87L 88%, FVC: 3.97L 97, ratio consistent with nonobstructive pattern  Assessment and plan:   Rhinoconjunctivitis, presumed allergic Year-round symptoms of nasal congestion, postnasal drip, sneezing, and occasional itchy eyes and ears. Currently using Afrin nasal spray daily. -Start antihistamine Xyzal 5 mg or Allegra 180 mg daily -Discontinue daily use of Afrin or other decongestant nasal sprays due to risk of rebound congestion with long-term use. -Use Afrin 2 sprays each nostril no more than 3-5 days in a row if you can not breathe through your nose.  After use of Afrin, wait the 5-15 minutes when you can breathe better then use your maintenance nasal spray as below.  After 3-5 days stop Afrin and continue your maintenance spray daily -Use Ryaltris (combination of olopatadine - for nasal drainage control and mometasone-for congestion control) 2 sprays each nostril twice a day With using nasal sprays point tip of bottle toward eye on same side nostril and lean head slightly forward for best technique.   -Continue using nasal rinse device as tolerated, ideally before applying nasal sprays. -For itchy or watery eyes can use Pataday 1 drop each eye daily as needed -Undergo skin testing for environmental allergens on next visit, 10/04/23.  Hold  any antihistamines for 3 days prior to this visit.   Reactive airway, mild intermittent Infrequent episodes of wheezing and chest tightness, particularly when overheated or experiencing sinus symptoms. Has used albuterol inhaler with relief. -Lung function testing is normal today! -Have access to albuterol inhaler 2 puffs every 4-6 hours as needed for cough/wheeze/shortness of breath/chest tightness.  May use 15-20 minutes prior to activity.   Monitor frequency of use.    Asthma control goals:  Full participation in all desired activities (may need albuterol before activity) Albuterol use two time or less a week on average (not counting use with activity) Cough interfering with sleep two time or less a month Oral steroids no more than once a year No hospitalizations   Follow-up -Return for skin testing visit 10/04/23, avoiding antihistamines for three days prior.  I appreciate the opportunity to take part in Domonic's care. Please do not hesitate to contact me with questions.  Sincerely,   Margo Aye, MD Allergy/Immunology [facility]

## 2023-09-27 NOTE — Addendum Note (Signed)
Addended by: Orson Aloe on: 09/27/2023 04:41 PM   Modules accepted: Orders

## 2023-09-27 NOTE — Patient Instructions (Signed)
Rhinoconjunctivitis, presumed allergic Year-round symptoms of nasal congestion, postnasal drip, sneezing, and occasional itchy eyes and ears. Currently using Afrin nasal spray daily. -Start antihistamine Xyzal 5 mg or Allegra 180 mg daily -Discontinue daily use of Afrin or other decongestant nasal sprays due to risk of rebound congestion with long-term use. -Use Afrin 2 sprays each nostril no more than 3-5 days in a row if you can not breathe through your nose.  After use of Afrin, wait the 5-15 minutes when you can breathe better then use your maintenance nasal spray as below.  After 3-5 days stop Afrin and continue your maintenance spray daily -Use Ryaltris (combination of olopatadine - for nasal drainage control and mometasone-for congestion control) 2 sprays each nostril twice a day With using nasal sprays point tip of bottle toward eye on same side nostril and lean head slightly forward for best technique.   -Continue using nasal rinse device as tolerated, ideally before applying nasal sprays. -For itchy or watery eyes can use Pataday 1 drop each eye daily as needed -Undergo skin testing for environmental allergens on next visit, 10/04/23.  Hold any antihistamines for 3 days prior to this visit.   Wheeze, shortness of breath, chest tightness Infrequent episodes of wheezing and chest tightness, particularly when overheated or experiencing sinus symptoms. Has used albuterol inhaler with relief. -Lung function testing is normal today! -Have access to albuterol inhaler 2 puffs every 4-6 hours as needed for cough/wheeze/shortness of breath/chest tightness.  May use 15-20 minutes prior to activity.   Monitor frequency of use.    Asthma control goals:  Full participation in all desired activities (may need albuterol before activity) Albuterol use two time or less a week on average (not counting use with activity) Cough interfering with sleep two time or less a month Oral steroids no more than once  a year No hospitalizations   Follow-up -Return for skin testing visit 10/04/23, avoiding antihistamines for three days prior.

## 2023-10-04 ENCOUNTER — Encounter: Payer: Self-pay | Admitting: Allergy

## 2023-10-04 ENCOUNTER — Ambulatory Visit (INDEPENDENT_AMBULATORY_CARE_PROVIDER_SITE_OTHER): Payer: No Typology Code available for payment source | Admitting: Allergy

## 2023-10-04 DIAGNOSIS — J3089 Other allergic rhinitis: Secondary | ICD-10-CM

## 2023-10-04 DIAGNOSIS — J302 Other seasonal allergic rhinitis: Secondary | ICD-10-CM | POA: Diagnosis not present

## 2023-10-04 DIAGNOSIS — H1013 Acute atopic conjunctivitis, bilateral: Secondary | ICD-10-CM

## 2023-10-04 DIAGNOSIS — J4520 Mild intermittent asthma, uncomplicated: Secondary | ICD-10-CM

## 2023-10-04 NOTE — Progress Notes (Signed)
Follow-up Note  RE: Darren Bartlett MRN: [id number] DOB: December 24, 1980 Date of Office Visit: 10/04/2023   History of present illness: Darren Bartlett is a 42 y.o. male presenting today for skin testing.  He was last seen in the office on 09/27/2023 for his initial visit for rhinoconjunctivitis.  He is he will antihistamines for this appointment today.  He is in his usual state of health today.   Medication List: Current Outpatient Medications  Medication Sig Dispense Refill   acetaminophen (TYLENOL) 500 MG tablet Take 1,000 mg by mouth every 6 (six) hours as needed for mild pain. (Patient not taking: Reported on 09/27/2023)     albuterol (VENTOLIN HFA) 108 (90 Base) MCG/ACT inhaler Inhale 2 puffs into the lungs every 6 (six) hours as needed for wheezing or shortness of breath. (Patient not taking: Reported on 09/27/2023) 18 g 0   albuterol (VENTOLIN HFA) 108 (90 Base) MCG/ACT inhaler Inhale 2 puffs into the lungs every 6 (six) hours as needed for wheezing or shortness of breath. 18 g 1   famotidine (PEPCID) 20 MG tablet One after supper 30 tablet 11   famotidine (PEPCID) 20 MG tablet Take 20 mg by mouth daily as needed for heartburn or indigestion (OTC).     ibuprofen (ADVIL) 200 MG tablet Take 800 mg by mouth every 6 (six) hours as needed for mild pain or fever. (Patient not taking: Reported on 09/27/2023)     levocetirizine (XYZAL) 5 MG tablet Take 1 tablet (5 mg total) by mouth every evening. 30 tablet 5   Olopatadine-Mometasone (RYALTRIS) 665-25 MCG/ACT SUSP Place 2 sprays into the nose 2 (two) times daily. 29 g 5   No current facility-administered medications for this visit.     Known medication allergies: No Known Allergies   Physical examination (limited):  General: Alert, interactive, in no acute distress. Skin: Warm and dry, without lesions or rashes. Extremities:  No clubbing, cyanosis or edema.  Diagnositics/Labs:  Allergy testing:   Airborne Adult Perc - 10/04/23  0908     Time Antigen Placed 5621    Allergen Manufacturer Waynette Buttery    Location Back    Number of Test 55    Panel 1 Select    1. Control-Buffer 50% Glycerol Negative    2. Control-Histamine --   +/-   3. Bahia 2+    4. French [place] Negative    5. Johnson Negative    6. Kentucky Blue Negative    7. Meadow Fescue Negative    8. Perennial Rye Negative    9. Timothy Negative    10. Ragweed Mix Negative    11. Cocklebur 2+    12. Plantain,  English Negative    13. Baccharis Negative    14. Dog Fennel Negative    15. Russian Thistle Negative    16. Lamb's Quarters Negative    17. Sheep Sorrell Negative    18. Rough Pigweed Negative    19. Marsh Elder, Rough Negative    20. Mugwort, Common 2+    21. Box, Elder Negative    22. Cedar, red Negative    23. Sweet Gum Negative    24. Pecan Pollen Negative    25. Pine Mix Negative    26. Walnut, Black Pollen Negative    27. Red Mulberry 2+    28. Ash Mix Negative    29. Birch Mix Negative    30. Beech American 3+    31. Cottonwood, Guinea-Bissau Negative  32. Hickory, White Negative    33. Maple Mix Negative    34. Oak, [place] Mix Negative    35. Sycamore Eastern Negative    36. Alternaria Alternata Negative    37. Cladosporium Herbarum Negative    38. Aspergillus Mix Negative    39. Penicillium Mix Negative    40. Bipolaris Sorokiniana (Helminthosporium) Negative    41. Drechslera Spicifera (Curvularia) Negative    42. Mucor Plumbeus 2+    43. Fusarium Moniliforme Negative    44. Aureobasidium Pullulans (pullulara) Negative    45. Rhizopus Oryzae Negative    46. Botrytis Cinera Negative    47. Epicoccum Nigrum Negative    48. Phoma Betae Negative    49. Dust Mite Mix 3+    50. Cat Hair 10,000 BAU/ml Negative    51.  Dog Epithelia Negative    52. Mixed Feathers Negative    53. Horse Epithelia Negative    54. Cockroach, German Negative    55. Tobacco Leaf Negative             Allergy testing results were read and  interpreted by provider, documented by clinical staff.   Assessment and plan:   Allergic Rhinitis with conjunctivitis Year-round symptoms of nasal congestion, postnasal drip, sneezing, and occasional itchy eyes and ears.  - Testing today showed: grasses, weeds, trees, outdoor molds, and dust mites - Copy of test results provided.  - Avoidance measures provided.  -Use Xyzal 5 mg or Allegra 180 mg daily -Discontinue daily use of Afrin or other decongestant nasal sprays due to risk of rebound congestion with long-term use. -Use Afrin 2 sprays each nostril no more than 3-5 days in a row if you can not breathe through your nose.  After use of Afrin, wait the 5-15 minutes when you can breathe better then use your maintenance nasal spray as below.  After 3-5 days stop Afrin and continue your maintenance spray daily -Use Ryaltris (combination of olopatadine - for nasal drainage control and mometasone-for congestion control) 2 sprays each nostril twice a day With using nasal sprays point tip of bottle toward eye on same side nostril and lean head slightly forward for best technique.   -Continue using nasal rinse device as tolerated, ideally before applying nasal sprays. -For itchy or watery eyes can use Pataday 1 drop each eye daily as needed  - Consider allergy shots as a means of long-term control. - Allergy shots "re-train" and "reset" the immune system to ignore environmental allergens and decrease the resulting immune response to those allergens (sneezing, itchy watery eyes, runny nose, nasal congestion, etc).    - Allergy shots improve symptoms in 75-85% of patients.  - We can discuss more at a future appointment if the medications are not working for you.  Wheeze, shortness of breath, chest tightness Infrequent episodes of wheezing and chest tightness, particularly when overheated or experiencing sinus symptoms. Has used albuterol inhaler with relief. -Have access to albuterol inhaler 2  puffs every 4-6 hours as needed for cough/wheeze/shortness of breath/chest tightness.  May use 15-20 minutes prior to activity.   Monitor frequency of use.    Asthma control goals:  Full participation in all desired activities (may need albuterol before activity) Albuterol use two time or less a week on average (not counting use with activity) Cough interfering with sleep two time or less a month Oral steroids no more than once a year No hospitalizations   Follow-up in 4-6 months or sooner if needed  I appreciate the opportunity to take part in Jaymien's care. Please do not hesitate to contact me with questions.  Sincerely,   Margo Aye, MD Allergy/Immunology [facility]

## 2023-10-04 NOTE — Patient Instructions (Signed)
Allergic Rhinitis with conjunctivitis Year-round symptoms of nasal congestion, postnasal drip, sneezing, and occasional itchy eyes and ears.  - Testing today showed: grasses, weeds, trees, outdoor molds, and dust mites - Copy of test results provided.  - Avoidance measures provided.  -Use Xyzal 5 mg or Allegra 180 mg daily -Discontinue daily use of Afrin or other decongestant nasal sprays due to risk of rebound congestion with long-term use. -Use Afrin 2 sprays each nostril no more than 3-5 days in a row if you can not breathe through your nose.  After use of Afrin, wait the 5-15 minutes when you can breathe better then use your maintenance nasal spray as below.  After 3-5 days stop Afrin and continue your maintenance spray daily -Use Ryaltris (combination of olopatadine - for nasal drainage control and mometasone-for congestion control) 2 sprays each nostril twice a day With using nasal sprays point tip of bottle toward eye on same side nostril and lean head slightly forward for best technique.   -Continue using nasal rinse device as tolerated, ideally before applying nasal sprays. -For itchy or watery eyes can use Pataday 1 drop each eye daily as needed  - Consider allergy shots as a means of long-term control. - Allergy shots "re-train" and "reset" the immune system to ignore environmental allergens and decrease the resulting immune response to those allergens (sneezing, itchy watery eyes, runny nose, nasal congestion, etc).    - Allergy shots improve symptoms in 75-85% of patients.  - We can discuss more at a future appointment if the medications are not working for you.  Wheeze, shortness of breath, chest tightness Infrequent episodes of wheezing and chest tightness, particularly when overheated or experiencing sinus symptoms. Has used albuterol inhaler with relief. -Have access to albuterol inhaler 2 puffs every 4-6 hours as needed for cough/wheeze/shortness of breath/chest tightness.   May use 15-20 minutes prior to activity.   Monitor frequency of use.    Asthma control goals:  Full participation in all desired activities (may need albuterol before activity) Albuterol use two time or less a week on average (not counting use with activity) Cough interfering with sleep two time or less a month Oral steroids no more than once a year No hospitalizations   Follow-up in 4-6 months or sooner if needed

## 2023-10-05 NOTE — Addendum Note (Signed)
Addended by: Kellie Simmering, Arlan Birks on: 10/05/2023 12:19 PM   Modules accepted: Orders

## 2024-04-02 ENCOUNTER — Ambulatory Visit: Payer: No Typology Code available for payment source | Admitting: Allergy
